# Patient Record
Sex: Female | Born: 1960 | Hispanic: Refuse to answer | Marital: Married | Smoking: Never smoker
Health system: Southern US, Community
[De-identification: ages and names within clinical notes are randomized; demographics above are authoritative.]

## PROBLEM LIST (undated history)

## (undated) DIAGNOSIS — Z5189 Encounter for other specified aftercare: Secondary | ICD-10-CM

## (undated) DIAGNOSIS — R8271 Bacteriuria: Secondary | ICD-10-CM

## (undated) DIAGNOSIS — IMO0001 Reserved for inherently not codable concepts without codable children: Secondary | ICD-10-CM

## (undated) DIAGNOSIS — I10 Essential (primary) hypertension: Secondary | ICD-10-CM

## (undated) DIAGNOSIS — F419 Anxiety disorder, unspecified: Secondary | ICD-10-CM

## (undated) DIAGNOSIS — D649 Anemia, unspecified: Secondary | ICD-10-CM

## (undated) DIAGNOSIS — E7800 Pure hypercholesterolemia, unspecified: Secondary | ICD-10-CM

## (undated) DIAGNOSIS — Z87442 Personal history of urinary calculi: Secondary | ICD-10-CM

## (undated) DIAGNOSIS — N390 Urinary tract infection, site not specified: Secondary | ICD-10-CM

## (undated) DIAGNOSIS — N289 Disorder of kidney and ureter, unspecified: Secondary | ICD-10-CM

## (undated) HISTORY — DX: Personal history of urinary calculi: Z87.442

## (undated) HISTORY — DX: Bacteriuria: R82.71

## (undated) HISTORY — DX: Disorder of kidney and ureter, unspecified: N28.9

## (undated) HISTORY — DX: Pure hypercholesterolemia, unspecified: E78.00

---
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
## 2001-08-22 HISTORY — PX: ENDOMETRIAL BIOPSY: SHX622

## 2002-05-16 ENCOUNTER — Other Ambulatory Visit: Admission: RE | Admit: 2002-05-16 | Discharge: 2002-05-16 | Payer: Self-pay | Admitting: Obstetrics and Gynecology

## 2003-05-26 ENCOUNTER — Other Ambulatory Visit: Admission: RE | Admit: 2003-05-26 | Discharge: 2003-05-26 | Payer: Self-pay | Admitting: Obstetrics and Gynecology

## 2004-05-26 ENCOUNTER — Other Ambulatory Visit: Admission: RE | Admit: 2004-05-26 | Discharge: 2004-05-26 | Payer: Self-pay | Admitting: Obstetrics and Gynecology

## 2005-05-30 ENCOUNTER — Other Ambulatory Visit: Admission: RE | Admit: 2005-05-30 | Discharge: 2005-05-30 | Payer: Self-pay | Admitting: Obstetrics and Gynecology

## 2006-06-01 ENCOUNTER — Other Ambulatory Visit: Admission: RE | Admit: 2006-06-01 | Discharge: 2006-06-01 | Payer: Self-pay | Admitting: Obstetrics and Gynecology

## 2007-07-11 ENCOUNTER — Other Ambulatory Visit: Admission: RE | Admit: 2007-07-11 | Discharge: 2007-07-11 | Payer: Self-pay | Admitting: Obstetrics & Gynecology

## 2008-07-13 ENCOUNTER — Other Ambulatory Visit: Admission: RE | Admit: 2008-07-13 | Discharge: 2008-07-13 | Payer: Self-pay | Admitting: Obstetrics and Gynecology

## 2010-12-09 ENCOUNTER — Encounter (HOSPITAL_COMMUNITY)
Admission: RE | Admit: 2010-12-09 | Discharge: 2010-12-09 | Disposition: A | Discharge status: Home / Self Care | Payer: BC Managed Care – PPO | Source: Ambulatory Visit | Attending: Obstetrics and Gynecology | Admitting: Obstetrics and Gynecology

## 2010-12-09 ENCOUNTER — Other Ambulatory Visit: Payer: Self-pay

## 2010-12-09 ENCOUNTER — Encounter (HOSPITAL_COMMUNITY): Payer: Self-pay

## 2010-12-09 HISTORY — DX: Anemia, unspecified: D64.9

## 2010-12-09 HISTORY — DX: Reserved for inherently not codable concepts without codable children: IMO0001

## 2010-12-09 HISTORY — DX: Urinary tract infection, site not specified: N39.0

## 2010-12-09 HISTORY — DX: Essential (primary) hypertension: I10

## 2010-12-09 HISTORY — DX: Encounter for other specified aftercare: Z51.89

## 2010-12-09 HISTORY — DX: Anxiety disorder, unspecified: F41.9

## 2010-12-09 LAB — COMPREHENSIVE METABOLIC PANEL
AST: 14 U/L (ref 0–37)
Albumin: 3.8 g/dL (ref 3.5–5.2)
Alkaline Phosphatase: 78 U/L (ref 39–117)
BUN: 15 mg/dL (ref 6–23)
Creatinine, Ser: 0.87 mg/dL (ref 0.50–1.10)
Potassium: 3.7 mEq/L (ref 3.5–5.1)
Total Protein: 7.8 g/dL (ref 6.0–8.3)

## 2010-12-09 LAB — SURGICAL PCR SCREEN
MRSA, PCR: NEGATIVE
Staphylococcus aureus: NEGATIVE

## 2010-12-09 LAB — CBC
HCT: 39.7 % (ref 36.0–46.0)
MCHC: 30 g/dL (ref 30.0–36.0)
Platelets: 294 10*3/uL (ref 150–400)
RDW: 17.9 % — ABNORMAL HIGH (ref 11.5–15.5)

## 2010-12-09 NOTE — Anesthesia Preprocedure Evaluation (Addendum)
Anesthesia Evaluation  Name, MR# and DOB Patient awake  General Assessment Comment  Reviewed: Allergy & Precautions, H&P , Patient's Chart, lab work & pertinent test results and reviewed documented beta blocker date and time   History of Anesthesia Complications Negative for: history of anesthetic complications  Airway Mallampati: III TM Distance: <3 FB Neck ROM: full    Dental No notable dental hx    Pulmonaryneg pulmonary ROS    clear to auscultation  pulmonary exam normal   Cardiovascular Exercise Tolerance: Good hypertension, regular Normal   Neuro/Psych (+) {AN ROS/MED HX NEURO HEADACHES (+) Anxiety, Negative Neurological ROS Negative Psych ROS  GI/Hepatic/Renal negative GI ROS, negative Liver ROS, and negative Renal ROS (+)       Endo/Other  Negative Endocrine ROS (+)   Abdominal   Musculoskeletal  Hematology negative hematology ROS (+)   Peds  Reproductive/Obstetrics negative OB ROS   Anesthesia Other Findings EKG- cannot rule out ant infarct.  Asymptomatic with Good exc tolerance.  No need for further work up            Anesthesia Physical Anesthesia Plan  ASA: II  Anesthesia Plan: General   Post-op Pain Management:    Induction:   Airway Management Planned:   Additional Equipment:   Intra-op Plan:   Post-operative Plan:   Informed Consent: I have reviewed the patients History and Physical, chart, labs and discussed the procedure including the risks, benefits and alternatives for the proposed anesthesia with the patient or authorized representative who has indicated his/her understanding and acceptance.   Dental Advisory Given  Plan Discussed with: CRNA and Surgeon  Anesthesia Plan Comments:        Anesthesia Quick Evaluation

## 2010-12-09 NOTE — Patient Instructions (Signed)
20 Jaime Ingram  12/09/2010   Your procedure is scheduled on:  12/19/10  Report to [facility] at 0600 AM.  Call this number if you have problems the morning of surgery: [phone]   Remember:   Do not eat food:After Midnight.  Do not drink clear liquids: After Midnight.  Take these medicines the morning of surgery with A SIP OF WATER:per anesthesia   Do not wear jewelry, make-up or nail polish.  Do not wear lotions, powders, or perfumes. You may wear deodorant.  Do not shave 48 hours prior to surgery.  Do not bring valuables to the hospital.  Contacts, dentures or bridgework may not be worn into surgery.  Leave suitcase in the car. After surgery it may be brought to your room.  For patients admitted to the hospital, checkout time is 11:00 AM the day of discharge.   Patients discharged the day of surgery will not be allowed to drive home.  Name and phone number of your driver: Jonny Ruiz- [phone]  Special Instructions: CHG Shower Use Special Wash: 1/2 bottle night before surgery and 1/2 bottle morning of surgery.   Please read over the following fact sheets that you were given: MRSA Information

## 2010-12-18 MED ORDER — DEXTROSE 5 % IV SOLN
2.0000 g | INTRAVENOUS | Status: AC
  Administered 2010-12-19: 2 g via INTRAVENOUS
  Filled 2010-12-18: qty 2

## 2010-12-18 NOTE — H&P (Signed)
Jaime Ingram is an 50 y.o. female with heavy menstrual bleeding and anemia down to 7.7g with back pain and dysmenorrhea, for R-TLH.  Pus  10/19/2010 showed the uterus was 12 x 9 x 9 cm with about 16 fibroids, the largest of which was about 3 cm.  The adnexa showed bilateral 3 cm clear cysts.  The SHSG showed mutliple submucous fibroids, each about 2 cm.    Pertinent Gynecological History: Menses: flow is excessive with use of 2 pads or tampons on heaviest daysheavy with clots Bleeding: dysfunctional uterine bleeding Contraception: oral progesterone-only contraceptivemicronor, vasectomy  Last pap: normal Date: 09/08/10 normal but pap 07/15/2009 Agus with neg colpo and nl endo bx OB History: G2, P2   Menstrual History:  No LMP recorded.LMP 11/24/2010    Past Medical History  Diagnosis Date  . Hypertension   . UTI (lower urinary tract infection)     recurrent  . Anemia   . Blood transfusion     32 yrs ago  . Anxiety     Past Surgical History  Procedure Date  . Ureter surgery 32 yrs ago  . Appendectomy     No family history on file.  Social History:  reports that she has never smoked. She does not have any smokeless tobacco history on file. She reports that she drinks alcohol. She reports that she does not use illicit drugs.Married, husband John.  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    Allergies: No Known Allergies  Pt. Started on HCTZ 12.5 mg on 12/05/10 pending appt with her PCP. Review of Systems  All other systems reviewed and are negative.    Physical Exam  Constitutional: She is oriented to person, place, and time. She appears well-developed and well-nourished.  HENT:  Head: Normocephalic and atraumatic.  Eyes: Pupils are equal, round, and reactive to light.  Neck: Normal range of motion.  Cardiovascular: Normal rate, regular rhythm and normal heart sounds.   Respiratory: Effort normal and breath sounds normal.  GI: Bowel sounds are normal.    Genitourinary: Vagina normal.  Musculoskeletal: Normal range of motion.  Neurological: She is alert and oriented to person, place, and time.  Skin: Skin is warm and dry.  Psychiatric: She has a normal mood and affect.      Assessment/Plan:  50 yo MWF with symptomatic uterine fibroids, for R-TLH.     Anaston Koehn P 12/18/2010, 8:41 PM

## 2010-12-19 ENCOUNTER — Encounter (HOSPITAL_COMMUNITY)
Admission: RE | Disposition: A | Discharge status: Home / Self Care | Payer: Self-pay | Source: Ambulatory Visit | Attending: Obstetrics and Gynecology

## 2010-12-19 ENCOUNTER — Ambulatory Visit (HOSPITAL_COMMUNITY)
Admission: RE | Admit: 2010-12-19 | Discharge: 2010-12-20 | Disposition: A | Discharge status: Home / Self Care | Payer: BC Managed Care – PPO | Source: Ambulatory Visit | Attending: Obstetrics and Gynecology | Admitting: Obstetrics and Gynecology

## 2010-12-19 ENCOUNTER — Encounter (HOSPITAL_COMMUNITY): Payer: Self-pay | Admitting: Anesthesiology

## 2010-12-19 ENCOUNTER — Other Ambulatory Visit: Payer: Self-pay | Admitting: Obstetrics and Gynecology

## 2010-12-19 ENCOUNTER — Ambulatory Visit (HOSPITAL_COMMUNITY): Payer: BC Managed Care – PPO | Admitting: Certified Registered Nurse Anesthetist

## 2010-12-19 ENCOUNTER — Encounter (HOSPITAL_COMMUNITY): Payer: Self-pay | Admitting: *Deleted

## 2010-12-19 DIAGNOSIS — Z01818 Encounter for other preprocedural examination: Secondary | ICD-10-CM | POA: Insufficient documentation

## 2010-12-19 DIAGNOSIS — N8000 Endometriosis of the uterus, unspecified: Secondary | ICD-10-CM | POA: Insufficient documentation

## 2010-12-19 DIAGNOSIS — D250 Submucous leiomyoma of uterus: Secondary | ICD-10-CM | POA: Insufficient documentation

## 2010-12-19 DIAGNOSIS — D252 Subserosal leiomyoma of uterus: Secondary | ICD-10-CM | POA: Insufficient documentation

## 2010-12-19 DIAGNOSIS — Z01812 Encounter for preprocedural laboratory examination: Secondary | ICD-10-CM | POA: Insufficient documentation

## 2010-12-19 DIAGNOSIS — D251 Intramural leiomyoma of uterus: Secondary | ICD-10-CM | POA: Insufficient documentation

## 2010-12-19 HISTORY — PX: ROBOTIC ASSISTED LAPAROSCOPIC VAGINAL HYSTERECTOMY WITH FIBROID REMOVAL: SHX5387

## 2010-12-19 SURGERY — ROBOTIC ASSISTED TOTAL HYSTERECTOMY
Anesthesia: General | Site: Abdomen | Wound class: Clean Contaminated

## 2010-12-19 MED ORDER — ROCURONIUM BROMIDE 100 MG/10ML IV SOLN
INTRAVENOUS | Status: DC | PRN
  Administered 2010-12-19: 20 mg via INTRAVENOUS
  Administered 2010-12-19: 40 mg via INTRAVENOUS

## 2010-12-19 MED ORDER — NEOSTIGMINE METHYLSULFATE 1 MG/ML IJ SOLN
INTRAMUSCULAR | Status: AC
  Filled 2010-12-19: qty 10

## 2010-12-19 MED ORDER — KETOROLAC TROMETHAMINE 30 MG/ML IJ SOLN: 15.0000 mg | Freq: Once | INTRAMUSCULAR | Status: DC | PRN

## 2010-12-19 MED ORDER — PROPOFOL 10 MG/ML IV EMUL
INTRAVENOUS | Status: AC
  Filled 2010-12-19: qty 20

## 2010-12-19 MED ORDER — FENTANYL CITRATE 0.05 MG/ML IJ SOLN
INTRAMUSCULAR | Status: AC
  Filled 2010-12-19: qty 5

## 2010-12-19 MED ORDER — LACTATED RINGERS IR SOLN
Status: DC | PRN
  Administered 2010-12-19: 3000 mL

## 2010-12-19 MED ORDER — FAMOTIDINE 20 MG PO TABS: 20.0000 mg | ORAL_TABLET | Freq: Once | ORAL | Status: DC | PRN

## 2010-12-19 MED ORDER — NEOSTIGMINE METHYLSULFATE 1 MG/ML IJ SOLN
INTRAMUSCULAR | Status: DC | PRN
  Administered 2010-12-19: 5 mg via INTRAMUSCULAR

## 2010-12-19 MED ORDER — GLYCOPYRROLATE 0.2 MG/ML IJ SOLN
INTRAMUSCULAR | Status: AC
  Filled 2010-12-19: qty 2

## 2010-12-19 MED ORDER — CITRIC ACID-SODIUM CITRATE 334-500 MG/5ML PO SOLN: 30.0000 mL | Freq: Once | ORAL | Status: DC | PRN

## 2010-12-19 MED ORDER — METOCLOPRAMIDE HCL 10 MG PO TABS: 10.0000 mg | ORAL_TABLET | Freq: Once | ORAL | Status: DC | PRN

## 2010-12-19 MED ORDER — LIDOCAINE HCL (CARDIAC) 20 MG/ML IV SOLN
INTRAVENOUS | Status: AC
  Filled 2010-12-19: qty 5

## 2010-12-19 MED ORDER — MIDAZOLAM HCL 5 MG/5ML IJ SOLN
INTRAMUSCULAR | Status: DC | PRN
  Administered 2010-12-19: .5 mg via INTRAVENOUS
  Administered 2010-12-19: 1.5 mg via INTRAVENOUS

## 2010-12-19 MED ORDER — ROPIVACAINE HCL 5 MG/ML IJ SOLN
INTRAMUSCULAR | Status: DC | PRN
  Administered 2010-12-19: 85 mL via EPIDURAL

## 2010-12-19 MED ORDER — CELECOXIB 200 MG PO CAPS: 400.0000 mg | ORAL_CAPSULE | Freq: Two times a day (BID) | ORAL | Status: DC

## 2010-12-19 MED ORDER — PANTOPRAZOLE SODIUM 40 MG PO TBEC: 40.0000 mg | DELAYED_RELEASE_TABLET | Freq: Once | ORAL | Status: DC | PRN

## 2010-12-19 MED ORDER — KETOROLAC TROMETHAMINE 30 MG/ML IJ SOLN
INTRAMUSCULAR | Status: AC
  Filled 2010-12-19: qty 1

## 2010-12-19 MED ORDER — ACETAMINOPHEN 325 MG PO TABS: 325.0000 mg | ORAL_TABLET | ORAL | Status: DC | PRN

## 2010-12-19 MED ORDER — SODIUM CHLORIDE 0.9 % IJ SOLN: 3.0000 mL | Freq: Two times a day (BID) | INTRAMUSCULAR | Status: DC

## 2010-12-19 MED ORDER — ACETAMINOPHEN 10 MG/ML IV SOLN
1000.0000 mg | Freq: Four times a day (QID) | INTRAVENOUS | Status: DC
  Administered 2010-12-19: 1000 mg via INTRAVENOUS
  Filled 2010-12-19 (×4): qty 100

## 2010-12-19 MED ORDER — ONDANSETRON HCL 4 MG/2ML IJ SOLN
INTRAMUSCULAR | Status: DC | PRN
  Administered 2010-12-19 (×2): 2 mg via INTRAVENOUS

## 2010-12-19 MED ORDER — HYDROMORPHONE HCL 1 MG/ML IJ SOLN
INTRAMUSCULAR | Status: AC
  Filled 2010-12-19: qty 1

## 2010-12-19 MED ORDER — GLYCOPYRROLATE 0.2 MG/ML IJ SOLN
INTRAMUSCULAR | Status: DC | PRN
  Administered 2010-12-19: .8 mg via INTRAVENOUS

## 2010-12-19 MED ORDER — DEXTROSE IN LACTATED RINGERS 5 % IV SOLN: INTRAVENOUS | Status: DC

## 2010-12-19 MED ORDER — HYDROMORPHONE HCL 1 MG/ML IJ SOLN
INTRAMUSCULAR | Status: DC | PRN
  Administered 2010-12-19 (×2): 1 mg via INTRAVENOUS

## 2010-12-19 MED ORDER — ROCURONIUM BROMIDE 50 MG/5ML IV SOLN
INTRAVENOUS | Status: AC
  Filled 2010-12-19: qty 1

## 2010-12-19 MED ORDER — SCOPOLAMINE 1 MG/3DAYS TD PT72: 1.0000 | MEDICATED_PATCH | Freq: Once | TRANSDERMAL | Status: DC | PRN

## 2010-12-19 MED ORDER — LIDOCAINE HCL (CARDIAC) 20 MG/ML IV SOLN
INTRAVENOUS | Status: DC | PRN
  Administered 2010-12-19: 60 mg via INTRAVENOUS

## 2010-12-19 MED ORDER — SODIUM CHLORIDE 0.9 % IV SOLN: 250.0000 mL | INTRAVENOUS | Status: DC

## 2010-12-19 MED ORDER — PROPOFOL 10 MG/ML IV EMUL
INTRAVENOUS | Status: DC | PRN
  Administered 2010-12-19: 200 mg via INTRAVENOUS

## 2010-12-19 MED ORDER — FENTANYL CITRATE 0.05 MG/ML IJ SOLN
INTRAMUSCULAR | Status: DC | PRN
  Administered 2010-12-19: 150 ug via INTRAVENOUS
  Administered 2010-12-19: 100 ug via INTRAVENOUS
  Administered 2010-12-19: 50 ug via INTRAVENOUS
  Administered 2010-12-19: 100 ug via INTRAVENOUS

## 2010-12-19 MED ORDER — PROMETHAZINE HCL 25 MG/ML IJ SOLN: 6.2500 mg | INTRAMUSCULAR | Status: DC | PRN

## 2010-12-19 MED ORDER — FENTANYL CITRATE 0.05 MG/ML IJ SOLN: 25.0000 ug | INTRAMUSCULAR | Status: DC | PRN

## 2010-12-19 MED ORDER — DEXAMETHASONE SODIUM PHOSPHATE 10 MG/ML IJ SOLN
INTRAMUSCULAR | Status: DC | PRN
  Administered 2010-12-19 (×2): 5 mg via INTRAVENOUS

## 2010-12-19 MED ORDER — SODIUM CHLORIDE 0.9 % IJ SOLN: 3.0000 mL | INTRAMUSCULAR | Status: DC | PRN

## 2010-12-19 MED ORDER — ONDANSETRON HCL 4 MG/2ML IJ SOLN
INTRAMUSCULAR | Status: AC
  Filled 2010-12-19: qty 2

## 2010-12-19 MED ORDER — MORPHINE SULFATE 4 MG/ML IJ SOLN: 1.0000 mg | INTRAMUSCULAR | Status: DC | PRN

## 2010-12-19 MED ORDER — KETOROLAC TROMETHAMINE 30 MG/ML IJ SOLN
INTRAMUSCULAR | Status: DC | PRN
  Administered 2010-12-19: 30 mg via INTRAVENOUS

## 2010-12-19 MED ORDER — LACTATED RINGERS IV SOLN
INTRAVENOUS | Status: DC
  Administered 2010-12-19: 07:00:00 via INTRAVENOUS

## 2010-12-19 MED ORDER — PROMETHAZINE HCL 25 MG/ML IJ SOLN
12.5000 mg | INTRAMUSCULAR | Status: DC | PRN
  Administered 2010-12-19: 12.5 mg via INTRAVENOUS
  Filled 2010-12-19: qty 1

## 2010-12-19 MED ORDER — MIDAZOLAM HCL 2 MG/2ML IJ SOLN
INTRAMUSCULAR | Status: AC
  Filled 2010-12-19: qty 2

## 2010-12-19 MED ORDER — OXYCODONE-ACETAMINOPHEN 5-325 MG PO TABS
1.0000 | ORAL_TABLET | ORAL | Status: DC | PRN
  Administered 2010-12-19 (×2): 1 via ORAL
  Filled 2010-12-19 (×2): qty 1

## 2010-12-19 MED ORDER — DEXAMETHASONE SODIUM PHOSPHATE 10 MG/ML IJ SOLN
INTRAMUSCULAR | Status: AC
  Filled 2010-12-19: qty 1

## 2010-12-19 SURGICAL SUPPLY — 53 items
BARRIER ADHS 3X4 INTERCEED (GAUZE/BANDAGES/DRESSINGS) ×2 IMPLANT
BENZOIN TINCTURE PRP APPL 2/3 (GAUZE/BANDAGES/DRESSINGS) ×2 IMPLANT
BLADELESS LONG 8MM (BLADE) IMPLANT
CABLE HIGH FREQUENCY MONO STRZ (ELECTRODE) ×2 IMPLANT
CONT PATH 16OZ SNAP LID 3702 (MISCELLANEOUS) ×2 IMPLANT
COVER MAYO STAND STRL (DRAPES) ×2 IMPLANT
COVER TABLE BACK 60X90 (DRAPES) ×4 IMPLANT
COVER TIP SHEARS 8 DVNC (MISCELLANEOUS) ×1 IMPLANT
COVER TIP SHEARS 8MM DA VINCI (MISCELLANEOUS) ×1
DECANTER SPIKE VIAL GLASS SM (MISCELLANEOUS) ×2 IMPLANT
DRAPE HUG U DISPOSABLE (DRAPE) ×2 IMPLANT
DRAPE LG THREE QUARTER DISP (DRAPES) ×4 IMPLANT
DRAPE MONITOR DA VINCI (DRAPE) ×2 IMPLANT
DRAPE UTILITY XL STRL (DRAPES) ×2 IMPLANT
DRAPE WARM FLUID 44X44 (DRAPE) ×2 IMPLANT
ELECT REM PT RETURN 9FT ADLT (ELECTROSURGICAL) ×2
ELECTRODE REM PT RTRN 9FT ADLT (ELECTROSURGICAL) ×1 IMPLANT
EVACUATOR SMOKE 8.L (FILTER) ×2 IMPLANT
GAUZE VASELINE 3X9 (GAUZE/BANDAGES/DRESSINGS) IMPLANT
GLOVE BIO SURGEON STRL SZ 6.5 (GLOVE) ×6 IMPLANT
GLOVE BIOGEL PI IND STRL 7.0 (GLOVE) ×4 IMPLANT
GLOVE BIOGEL PI INDICATOR 7.0 (GLOVE) ×4
GLOVE ECLIPSE 6.5 STRL STRAW (GLOVE) ×8 IMPLANT
GOWN PREVENTION PLUS LG XLONG (DISPOSABLE) ×14 IMPLANT
KIT DISP ACCESSORY 4 ARM (KITS) ×2 IMPLANT
NEEDLE INSUFFLATION 14GA 120MM (NEEDLE) ×2 IMPLANT
NS IRRIG 1000ML POUR BTL (IV SOLUTION) ×6 IMPLANT
OCCLUDER COLPOPNEUMO (BALLOONS) ×2 IMPLANT
PACK LAVH (CUSTOM PROCEDURE TRAY) ×2 IMPLANT
PAD PREP 24X48 CUFFED NSTRL (MISCELLANEOUS) ×4 IMPLANT
SET IRRIG TUBING LAPAROSCOPIC (IRRIGATION / IRRIGATOR) ×2 IMPLANT
SOLUTION ELECTROLUBE (MISCELLANEOUS) ×2 IMPLANT
SPONGE LAP 18X18 X RAY DECT (DISPOSABLE) IMPLANT
STERI STRIP BROWN 1/4X3 R155 (GAUZE/BANDAGES/DRESSINGS) ×2 IMPLANT
STRIP CLOSURE SKIN 1/4X4 (GAUZE/BANDAGES/DRESSINGS) ×2 IMPLANT
SUT VIC AB 0 CT1 27 (SUTURE) ×5
SUT VIC AB 0 CT1 27XBRD ANTBC (SUTURE) ×5 IMPLANT
SUT VICRYL 0 UR6 27IN ABS (SUTURE) ×2 IMPLANT
SUT VICRYL RAPIDE 4/0 PS 2 (SUTURE) ×4 IMPLANT
SYR 50ML LL SCALE MARK (SYRINGE) ×2 IMPLANT
SYSTEM CONVERTIBLE TROCAR (TROCAR) IMPLANT
TIP UTERINE 5.1X6CM LAV DISP (MISCELLANEOUS) IMPLANT
TIP UTERINE 6.7X10CM GRN DISP (MISCELLANEOUS) ×2 IMPLANT
TIP UTERINE 6.7X6CM WHT DISP (MISCELLANEOUS) IMPLANT
TIP UTERINE 6.7X8CM BLUE DISP (MISCELLANEOUS) IMPLANT
TOWEL OR 17X24 6PK STRL BLUE (TOWEL DISPOSABLE) ×6 IMPLANT
TRAY FOLEY BAG SILVER LF 14FR (CATHETERS) ×2 IMPLANT
TROCAR 12M 150ML BLUNT (TROCAR) IMPLANT
TROCAR DISP BLADELESS 8 DVNC (TROCAR) ×1 IMPLANT
TROCAR DISP BLADELESS 8MM (TROCAR) ×1
TROCAR XCEL 12X100 BLDLESS (ENDOMECHANICALS) ×2 IMPLANT
TUBING FILTER THERMOFLATOR (ELECTROSURGICAL) ×2 IMPLANT
WARMER LAPAROSCOPE (MISCELLANEOUS) ×2 IMPLANT

## 2010-12-19 NOTE — Transfer of Care (Signed)
Immediate Anesthesia Transfer of Care Note  Patient: Jaime Ingram  Procedure(s) Performed:  ROBOTIC ASSISTED TOTAL HYSTERECTOMY  Patient Location: PACU  Anesthesia Type: General  Level of Consciousness: awake, alert  and oriented  Airway & Oxygen Therapy: Patient Spontanous Breathing and Patient connected to nasal cannula oxygen  Post-op Assessment: Report given to PACU RN, Post -op Vital signs reviewed and stable and Patient moving all extremities X 4  Post vital signs: Reviewed and stable  Complications: No apparent anesthesia complications

## 2010-12-19 NOTE — Preoperative (Signed)
Beta Blockers   Reason not to administer Beta Blockers:Not prescribed

## 2010-12-19 NOTE — Anesthesia Postprocedure Evaluation (Signed)
  Anesthesia Post-op Note  Patient: Jaime Ingram  Procedure(s) Performed:  ROBOTIC ASSISTED TOTAL HYSTERECTOMY  Patient Location: PACU and Women's Unit  Anesthesia Type: General  Level of Consciousness: awake, alert  and oriented  Airway and Oxygen Therapy: Patient Spontanous Breathing   Post-op Assessment: Post-op Vital signs reviewed and Patient's Cardiovascular Status Stable  Post-op Vital Signs: Reviewed and stable  Complications: No apparent anesthesia complications

## 2010-12-19 NOTE — Anesthesia Postprocedure Evaluation (Signed)
  Anesthesia Post Note  Patient: Jaime Ingram  Procedure(s) Performed:  ROBOTIC ASSISTED TOTAL HYSTERECTOMY  Anesthesia type: GA  Patient location: PACU  Post pain: Pain level controlled  Post assessment: Post-op Vital signs reviewed  Last Vitals:  Filed Vitals:   12/19/10 1200  BP: 145/68  Pulse: 60  Temp: 98.7 F (37.1 C)  Resp: 12    Post vital signs: Reviewed  Level of consciousness: sedated  Complications: No apparent anesthesia complications

## 2010-12-19 NOTE — Op Note (Signed)
Preoperative diagnosis multiple uterine fibroids symptomatic Postoperative diagnosis same Path pending Procedure robotic assisted total laparoscopic hysterectomy Surgeon Dr. Aram Beecham Keghan Mcfarren Asst. Dr. Leda Quail Anesthesia Gen. endotracheal Estimated blood loss 75 cc Complications none Procedure The patient was taken to the operating room and after induction of adequate general endotracheal anesthesia was placed in the low dorsolithotomy position and prepped and draped in usual fashion.  A posterior weighted and anterior Deaver were placed the cervix was grasped on its anterior lip with a single-tooth tenaculum.  The uterus sounded to 11 cm.  The cervix was dilated to #23 Shawnie Pons.  A 10 cm Rumi manipulator with a medium Koh ring was placed on the cervix without difficulty.  Prior to placement of the Koh ring the vagina had been infiltrated with a total of 10 cc of ropivacaine 0.5% that had been diluted one-to-one with sterile saline.  A Foley catheter was inserted.  Attention was next turned to the abdomen.  A small incision was made just above the umbilicus after infiltrating the skin with the diluted half percent ropivacaine.  The skin was incised with a knife.  A Veress needle was inserted into the peritoneal space.  Proper placement of the Veress needle was tested by noting a negative aspirate then free flow of saline through the varies needle again with a negative aspirate and then by noting the response of a drop of saline  placed at the hub of the Verres needle to negative pressure as the abdominal wall was elevated.  A 12 mm disposable trocar was then placed into the peritoneal space and proper placement noted with the laparoscope 60 cc of the ropivacaine solution was inserted through the trocar to date the pelvis sites for the assistant's port and the robotic ports were marked with a marking pen and transilluminated the sites were infiltrated with a dilute ropivacaine solution incised with a knife  and the robotic ports were placed approximately 10 cm lateral to the umbilicus incision on each side and these were placed under direct visualization.  The 12 mm assistant's port the right lower quadrant was also placed and draped visualization.  The robot was then brought in and out from the side.  Monopolar scissors were inserted into port 1 and direct visualization and the PK gyrus was inserted into port 2 again under direct visualization.  The surgeon then proceeded to the consult.  The anatomy was inspected the uterus was quite enlarged consistent with 14 weeks size and multinodular.  The ovaries were normal.  The tubes were also normal.  The anterior posterior cul-de-sacs were free.  The ureters were identified peristalsing on both sides.  The procedure began on the patient's right.  The utero-ovarian ligament tube and mesosalpinx were coagulated and then cut with the monopolar scissors the round ligament was coagulated with the PK gyrus and cut the monopolar scissors anterior and posterior leaves of the broad ligament were opened sharply.  The bladder was sharply dissected dissected off the cervix.  Uterine artery on the patient's right was skeletonized multiply coagulated and cut.  Attention then turned to the patient's left.  The utero-ovarian ligament tube and mesosalpinx were coagulated and then cut. the round ligament was also coagulated and cut. the anterior and posterior leaves of the broad ligament were taken down sharply.  The bladder was further dissected off the cervix.  The uterine artery on the patient's left was skeletonized, coagulated, and cut.  Because the uterus was so large it was felt necessary to  remove some of the fibroids in order to allow the uterus to be delivered through the vagina area at to fibroids at the fundus were incised the monopolar scissors and then dissected so that they were dangling off the top of the fundus and one fibroid was completely dissected out of the uterus.  A  colpotomy incision was made with the monopolar scissors circumferentially.  An attempt was made to deliver the uterus vaginally however it was still too large to come through the vagina.  The assistant then went vaginally and did a morcellation until the uterus could be delivered.  The total uterine weight was 320 g.  The instruments were then changed out for a negative suture cut and port one and a Prograf and port 2.  5 sutures of 0 Vicryl were used in figure-of-eight fashion to close the vaginal cuff.  Hemostasis was noted.  The pelvis was urinated and again good hemostasis was noted.  The ureters were seen peristalsing bilaterally.  A sheet of Interceed was then introduced and the pelvis then placed over the vaginal cuff.  The robotic instruments were then removed. Visualization of the upper abdomen revealed the liver to be smooth.  The stomach was normal.  No upper abdominal abnormalities were noted.  The trocar sleeves were then removed under direct visualization.  Pneumoperitoneum was allowed to escape through the trocar sleeve at the umbilicus prior to its removal.  The fascia at the umbilicus port and the assistant's port was closed with a single suture of 0 Vicryl.  The skin was closed subcuticularly with 4-0 Vicryl Rapide, benzoin, and Steri-Strips.  Instruments removed from the vagina and the procedure was terminated.  Sponge, needle, and instrument counts were correct x3.

## 2010-12-19 NOTE — Progress Notes (Signed)
Encounter addended by: Cephus Shelling on: 12/19/2010  5:56 PM<BR>     Documentation filed: Notes Section

## 2010-12-20 NOTE — Progress Notes (Signed)
1 Day Post-Op Procedure(s): ROBOTIC ASSISTED TOTAL HYSTERECTOMY  Subjective: Patient reports no problems voiding.feeling good.  Adequate pain control.  No bleeding.  Tolerating reg diet.     Objective: I have reviewed patient's vital signs.VSS afebrile.  Abd soft, NT.  Good UOP.  General: alert  Assessment: s/p Procedure(s): ROBOTIC ASSISTED TOTAL HYSTERECTOMY: stable  Plan: D/C/ home. Instructed.  LOS: 1 day    Majesty Stehlin P 12/20/2010, 10:56 AM

## 2010-12-23 NOTE — Discharge Summary (Signed)
Physician Discharge Summary  Patient ID: Jaime Ingram MRN: [id number] DOB/AGE: 12-Nov-1960 50 y.o.  Admit date: 12/19/2010 Discharge date: 12/23/2010  Admission Diagnoses: menorrhagia, fibroids Discharge Diagnoses: same, plus adenomysisi Active Problems:  * No active hospital problems. *    Discharged Condition: good  Hospital Course: Pt admitted and underwent Robotic TLH for a 321g fibroid uterus.  Surgery was uncomplicated.  Post op course uneventful.  D/C'd am of POD 1.  Consults: none  Significant Diagnostic Studies: labs: none  Treatments: surgery: as above  Discharge Exam: Blood pressure 167/90, pulse 76, temperature 99.5 F (37.5 C), temperature source Oral, resp. rate 18, height 5\' 4"  (1.626 m), weight 80.287 kg (177 lb), SpO2 98.00%. nl post op exam.    Disposition: Home or Self Care   Discharge Medication List as of 12/20/2010 12:28 PM    CONTINUE these medications which have NOT CHANGED   Details  calcium-vitamin D (OSCAL WITH D) 500-200 MG-UNIT per tablet Take 1 tablet by mouth daily.  , Until Discontinued, Historical Med    CRANBERRY PO Take 1 tablet by mouth.  , Until Discontinued, Historical Med    fish oil-omega-3 fatty acids 1000 MG capsule Take 2 g by mouth daily.  , Until Discontinued, Historical Med    GARLIC PO Take 1 tablet by mouth.  , Until Discontinued, Historical Med    hydrochlorothiazide (HYDRODIURIL) 12.5 MG tablet Take 12.5 mg by mouth daily.  , Until Discontinued, Historical Med    IRON PO Take 1 tablet by mouth daily.  , Until Discontinued, Historical Med    Multiple Vitamins-Minerals (MULTIVITAMIN WITH MINERALS) tablet Take 1 tablet by mouth daily.  , Until Discontinued, Historical Med      STOP taking these medications     norethindrone (MICRONOR,CAMILA,ERRIN) 0.35 MG tablet        Follow-up Information    Follow up with Tracie Lindbloom P on 01/20/2011. (4:30 pm)    Contact information:   [address] [place] [address] [phone]          Signed: Alison Murray 12/23/2010, 1:58 PM

## 2011-10-26 LAB — HM MAMMOGRAPHY: HM Mammogram: NEGATIVE

## 2012-10-11 ENCOUNTER — Encounter: Payer: Self-pay | Admitting: *Deleted

## 2012-10-16 ENCOUNTER — Encounter: Payer: Self-pay | Admitting: Nurse Practitioner

## 2012-10-16 ENCOUNTER — Ambulatory Visit (INDEPENDENT_AMBULATORY_CARE_PROVIDER_SITE_OTHER): Payer: BC Managed Care – PPO | Admitting: Nurse Practitioner

## 2012-10-16 VITALS — BP 108/70 | HR 72 | Resp 14 | Ht 64.0 in | Wt 188.4 lb

## 2012-10-16 DIAGNOSIS — Z01419 Encounter for gynecological examination (general) (routine) without abnormal findings: Secondary | ICD-10-CM

## 2012-10-16 DIAGNOSIS — Z0000 Encounter for general adult medical examination without abnormal findings: Secondary | ICD-10-CM

## 2012-10-16 LAB — POCT URINALYSIS DIPSTICK: Spec Grav, UA: 1.015

## 2012-10-16 NOTE — Progress Notes (Signed)
52 y.o. G4P2 Married Caucasian Fe here for annual exam.  Since last year with hysterectomy she has done so much better.  Feels well, and seems motivated to be healthy and changing habits. No other new health diagnosis except recent change in BP med's. Occasional vaso symptoms. No vaginal dryness.  Husband who had prostate cancer is doing well.  Patient's last menstrual period was 11/28/2010.          Sexually active: yes  The current method of family planning is status post hysterectomy.    Exercising: yes  swimming, walking and some weights Smoker:  no  Health Maintenance: Pap:  09/06/2010  Negative (history of AGUS 07/2009 MMG:  10/2011  scheduled for next week Colonoscopy:  05/2011 normal BMD:   never TDaP:  10/16/2011 Labs: PCP does blood (labs) work.   reports that she has never smoked. She has never used smokeless tobacco. She reports that she drinks about 0.5 ounces of alcohol per week. She reports that she does not use illicit drugs.  Past Medical History  Diagnosis Date  . Hypertension   . UTI (lower urinary tract infection)     recurrent  . Anemia   . Blood transfusion     32 yrs ago  . Anxiety   . Iron deficiency   . Asymptomatic bacteriuria     Past Surgical History  Procedure Laterality Date  . Ureter surgery  32 yrs ago  . Appendectomy    . Ureter repair      to right kidney  . Endometrial biopsy  08/22/2001  . Abdominal hysterectomy      TLH    Current Outpatient Prescriptions  Medication Sig Dispense Refill  . acetaminophen (TYLENOL) 325 MG tablet Take 650 mg by mouth every 6 (six) hours as needed for pain.      Marland Kitchen amLODipine-valsartan (EXFORGE) 10-320 MG per tablet Take 1 tablet by mouth daily.      . fish oil-omega-3 fatty acids 1000 MG capsule Take 2 g by mouth daily.      . hydrochlorothiazide (HYDRODIURIL) 12.5 MG tablet Take 12.5 mg by mouth daily.        . Multiple Vitamins-Minerals (MULTIVITAMIN WITH MINERALS) tablet Take 1 tablet by mouth daily.         . pravastatin (PRAVACHOL) 40 MG tablet Take 40 mg by mouth daily.       No current facility-administered medications for this visit.    Family History  Problem Relation Age of Onset  . Diabetes Father   . Hypertension Father     ROS:  Pertinent items are noted in HPI.  Otherwise, a comprehensive ROS was negative.  Exam:   BP 108/70  Pulse 72  Resp 14  Ht 5\' 4"  (1.626 m)  Wt 188 lb 6.4 oz (85.458 kg)  BMI 32.32 kg/m2  LMP 11/28/2010 Height: 5\' 4"  (162.6 cm)  Ht Readings from Last 3 Encounters:  10/16/12 5\' 4"  (1.626 m)  12/19/10 5\' 4"  (1.626 m)  12/19/10 5\' 4"  (1.626 m)    General appearance: alert, cooperative and appears stated age Head: Normocephalic, without obvious abnormality, atraumatic Neck: no adenopathy, supple, symmetrical, trachea midline and thyroid normal to inspection and palpation Lungs: clear to auscultation bilaterally Breasts: normal appearance, no masses or tenderness Heart: regular rate and rhythm Abdomen: soft, non-tender; no masses,  no organomegaly Extremities: extremities normal, atraumatic, no cyanosis or edema Skin: Skin color, texture, turgor normal. No rashes or lesions Lymph nodes: Cervical, supraclavicular, and axillary nodes  normal. No abnormal inguinal nodes palpated Neurologic: Grossly normal   Pelvic: External genitalia:  no lesions              Urethra:  normal appearing urethra with no masses, tenderness or lesions              Bartholin's and Skene's: normal                 Vagina: normal appearing vagina with normal color and discharge, no lesions              Cervix: absent              Pap taken: no Bimanual Exam:  Uterus:  uterus absent              Adnexa: no mass, fullness, tenderness               Rectovaginal: Confirms               Anus:  normal sphincter tone, no lesions  A:  Well Woman with normal exam  S/P R-TLH secondary to fibroids and adenomyosis 12/19/10   HTN  P:   Pap smear as per guidelines Not  indicated  Mammogram as scheduled  counseled on adequate intake of calcium and vitamin D, diet and exercise,   Kegel's exercises return annually or prn  An After Visit Summary was printed and given to the patient.

## 2012-10-16 NOTE — Progress Notes (Signed)
Reviewed personally.  M. Suzanne Julissa Browning, MD.  

## 2012-10-16 NOTE — Patient Instructions (Signed)

## 2013-09-15 ENCOUNTER — Encounter: Payer: Self-pay | Admitting: Nurse Practitioner

## 2013-09-16 ENCOUNTER — Encounter: Payer: Self-pay | Admitting: Nurse Practitioner

## 2013-10-20 ENCOUNTER — Ambulatory Visit: Payer: BC Managed Care – PPO | Admitting: Nurse Practitioner

## 2013-11-14 ENCOUNTER — Ambulatory Visit (INDEPENDENT_AMBULATORY_CARE_PROVIDER_SITE_OTHER): Payer: BC Managed Care – PPO | Admitting: Nurse Practitioner

## 2013-11-14 ENCOUNTER — Encounter: Payer: Self-pay | Admitting: Nurse Practitioner

## 2013-11-14 VITALS — BP 96/64 | HR 76 | Ht 64.0 in | Wt 186.0 lb

## 2013-11-14 DIAGNOSIS — Z0000 Encounter for general adult medical examination without abnormal findings: Secondary | ICD-10-CM

## 2013-11-14 DIAGNOSIS — Z01419 Encounter for gynecological examination (general) (routine) without abnormal findings: Secondary | ICD-10-CM

## 2013-11-14 NOTE — Progress Notes (Signed)
Patient ID: Jaime Ingram, female   DOB: 14-Dec-1960, 53 y.o.   MRN: [id number] 53 y.o. G4P2 Married Caucasian Fe here for annual exam.   Still on med's for cholesterol and HTN. UTI a month ago. and was on Cipro. At the urologist they did [place].  Kidney stone was found in right kidney and 1 on the left.  Urologist is having her on a 6 month watch.  Then will have a repeat CT and labs done in December.  Patient's last menstrual period was 11/28/2010.          Sexually active: Yes.    The current method of family planning is status post hysterectomy.    Exercising: Yes.    Home exercise routine includes swimming and walking. Smoker:  no  Health Maintenance: Pap: 09/06/2010 Negative (history of AGUS 07/2009  MMG: 11/14/13, report not available at time of apt Colonoscopy: 05/2011 normal  TDaP: 10/16/2011  Labs: PCP does blood (labs) work.    Urine:  [facility] within last 30 days    reports that she has never smoked. She has never used smokeless tobacco. She reports that she drinks about .5 ounces of alcohol per week. She reports that she does not use illicit drugs.  Past Medical History  Diagnosis Date  . Hypertension   . UTI (lower urinary tract infection)     recurrent  . Anemia   . Blood transfusion     32 yrs ago  . Anxiety   . Asymptomatic bacteriuria   . History of kidney stones     Past Surgical History  Procedure Laterality Date  . Ureter surgery  1980  . Endometrial biopsy  08/22/2001  . Appendectomy  1972  . Robotic assisted laparoscopic vaginal hysterectomy with fibroid removal  12/19/10    Ovaries remain    Current Outpatient Prescriptions  Medication Sig Dispense Refill  . amLODipine-valsartan (EXFORGE) 10-320 MG per tablet Take 1 tablet by mouth daily.      . Calcium-Magnesium-Vitamin D 300-20-200 MG-MG-UNIT CHEW Chew 1 each by mouth daily.      . Coenzyme Q10 (CO Q 10 PO) Take 300 mg by mouth daily.      . hydrochlorothiazide (HYDRODIURIL) 25 MG tablet Take 25 mg by  mouth daily.      . Multiple Vitamins-Minerals (MULTIVITAMIN WITH MINERALS) tablet Take 1 tablet by mouth daily.        . pravastatin (PRAVACHOL) 40 MG tablet Take 40 mg by mouth daily.       No current facility-administered medications for this visit.    Family History  Problem Relation Age of Onset  . Diabetes Father   . Hypertension Father   . Hyperlipidemia Sister   . Hypertension Sister   . Hyperlipidemia Sister   . Hypertension Sister   . Hyperlipidemia Sister   . Hypertension Sister   . Hyperlipidemia Sister   . Hypertension Sister     ROS:  Pertinent items are noted in HPI.  Otherwise, a comprehensive ROS was negative.  Exam:   BP 96/64  Pulse 76  Ht 5\' 4"  (1.626 m)  Wt 186 lb (84.369 kg)  BMI 31.91 kg/m2  LMP 11/28/2010 Height: 5\' 4"  (440.3 cm)  Ht Readings from Last 3 Encounters:  11/14/13 5\' 4"  (1.626 m)  10/16/12 5\' 4"  (1.626 m)  12/19/10 5\' 4"  (1.626 m)    General appearance: alert, cooperative and appears stated age Head: Normocephalic, without obvious abnormality, atraumatic Neck: no adenopathy, supple, symmetrical, trachea midline  and thyroid normal to inspection and palpation Lungs: clear to auscultation bilaterally Breasts: normal appearance, no masses or tenderness Heart: regular rate and rhythm Abdomen: soft, non-tender; no masses,  no organomegaly Extremities: extremities normal, atraumatic, no cyanosis or edema Skin: Skin color, texture, turgor normal. No rashes or lesions Lymph nodes: Cervical, supraclavicular, and axillary nodes normal. No abnormal inguinal nodes palpated Neurologic: Grossly normal   Pelvic: External genitalia:  no lesions              Urethra:  normal appearing urethra with no masses, tenderness or lesions              Bartholin's and Skene's: normal                 Vagina: normal appearing vagina with normal color and discharge, no lesions              Cervix: absent              Pap taken: Yes.   Bimanual Exam:   Uterus:  uterus absent              Adnexa: no mass, fullness, tenderness               Rectovaginal: Confirms               Anus:  normal sphincter tone, no lesions  A:  Well Woman with normal exam  S/P R-TLH secondary to fibroids 12/19/10  History of AGUS pap 2011  History of Renal Calculi  History of Ureteral surgery Right - at age 55  P:   Reviewed health and wellness pertinent to exam  Pap smear taken today  Mammogram is due 11/2014  Counseled on breast self exam, mammography screening, adequate intake of calcium and vitamin D, diet and exercise, Kegel's exercises return annually or prn  An After Visit Summary was printed and given to the patient.

## 2013-11-14 NOTE — Patient Instructions (Signed)

## 2013-11-17 NOTE — Progress Notes (Signed)
Note reviewed, agree with plan.  Clell Trahan, MD  

## 2013-11-20 ENCOUNTER — Ambulatory Visit: Payer: BC Managed Care – PPO | Admitting: Nurse Practitioner

## 2013-11-20 LAB — IPS PAP TEST WITH HPV

## 2013-11-25 ENCOUNTER — Ambulatory Visit: Payer: BC Managed Care – PPO | Admitting: Nurse Practitioner

## 2014-01-08 ENCOUNTER — Telehealth: Payer: Self-pay | Admitting: Nurse Practitioner

## 2014-01-08 NOTE — Telephone Encounter (Signed)
Patient is calling regarding her EOB from her secondary insurance Patient says the EOB is stating the lab did not get her primary insurance. I told patient she needs to call [facility] and give her primary insurance.info. She said this EOB if [place] out of [place]? She has no info to contact them.

## 2014-03-09 ENCOUNTER — Encounter: Payer: Self-pay | Admitting: Nurse Practitioner

## 2014-06-16 ENCOUNTER — Telehealth: Payer: Self-pay | Admitting: Nurse Practitioner

## 2014-06-16 NOTE — Telephone Encounter (Signed)
Left message regarding upcoming appointment has been canceled and needs to be rescheduled. °

## 2014-09-14 ENCOUNTER — Other Ambulatory Visit: Payer: Self-pay | Admitting: Family Medicine

## 2014-09-14 DIAGNOSIS — R911 Solitary pulmonary nodule: Secondary | ICD-10-CM

## 2014-11-27 ENCOUNTER — Ambulatory Visit: Payer: BC Managed Care – PPO | Admitting: Nurse Practitioner

## 2014-11-30 ENCOUNTER — Ambulatory Visit: Payer: BC Managed Care – PPO | Admitting: Nurse Practitioner

## 2014-12-29 ENCOUNTER — Encounter: Payer: Self-pay | Admitting: Nurse Practitioner

## 2014-12-29 ENCOUNTER — Ambulatory Visit (INDEPENDENT_AMBULATORY_CARE_PROVIDER_SITE_OTHER): Payer: BC Managed Care – PPO | Admitting: Nurse Practitioner

## 2014-12-29 VITALS — BP 110/66 | HR 68 | Ht 64.25 in | Wt 185.0 lb

## 2014-12-29 DIAGNOSIS — Z0000 Encounter for general adult medical examination without abnormal findings: Secondary | ICD-10-CM | POA: Diagnosis not present

## 2014-12-29 DIAGNOSIS — Z01419 Encounter for gynecological examination (general) (routine) without abnormal findings: Secondary | ICD-10-CM | POA: Diagnosis not present

## 2014-12-29 NOTE — Progress Notes (Signed)
Patient ID: Jaime Ingram, female   DOB: 02-18-1961, 54 y.o.   MRN: [id number] 54 y.o. G81P2002 Married  Caucasian Fe here for annual exam.  Last fall had another UTI and treated.  She then had CT scan on kidneys with renal stones on the right. There was something ? that showed up on one of the lung bases and she is scheduled by PCP to have a chest CT scan done this December.  The CT scan also showed normal ovaries.  She has some abnormal GFR with renal studies. She was treated with increase of fluids and avoid dehydration. She has also been seeing Uro PT and had good results with pelvic floor prolapse exercise.  She is doing ball exercise. Her husband also has benefited from these exercises as he is recovered from prostate cancer.    Patient's last menstrual period was 11/28/2010 (exact date).          Sexually active: Yes.    The current method of family planning is status post hysterectomy.    Exercising: Yes.    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 She has never used smokeless tobacco. She reports that she drinks about 0.5 oz of alcohol per week. She reports that she does not use illicit drugs.  Past Medical History  Diagnosis Date  . Hypertension   . UTI (lower urinary tract infection)     recurrent  . Anemia   . Blood transfusion     32 yrs ago  . Anxiety   . Asymptomatic bacteriuria   . History of kidney stones     Past Surgical History  Procedure Laterality Date  . Ureter surgery  1980  . Endometrial biopsy  08/22/2001  . Appendectomy  1972  . Robotic assisted laparoscopic vaginal hysterectomy with fibroid removal  12/19/10    Ovaries remain    Current Outpatient Prescriptions   Medication Sig Dispense Refill  . Amlodipine-Valsartan-HCTZ 10-320-25 MG TABS Take 1 tablet by mouth every morning.  5  . Coenzyme Q10 (CO Q 10 PO) Take 300 mg by mouth daily.    . Multiple Vitamins-Minerals (MULTIVITAMIN WITH MINERALS) tablet Take 1 tablet by mouth daily.      . pravastatin (PRAVACHOL) 40 MG tablet Take 40 mg by mouth daily.     No current facility-administered medications for this visit.    Family History  Problem Relation Age of Onset  . Diabetes Father   . Hypertension Father   . Hyperlipidemia Sister   . Hypertension Sister   . Hyperlipidemia Sister   . Hypertension Sister   . Hyperlipidemia Sister   . Hypertension Sister   . Hyperlipidemia Sister   . Hypertension Sister     ROS:  Pertinent items are noted in HPI.  Otherwise, a comprehensive ROS was negative.  Exam:   BP 110/66 mmHg  Pulse 68  Ht 5' 4.25" (1.632 m)  Wt 185 lb (83.915 kg)  BMI 31.51 kg/m2  LMP 11/28/2010 (Exact Date) Height: 5' 4.25" (163.2 cm) Ht Readings from Last 3 Encounters:  12/29/14 5' 4.25" (1.632 m)  11/14/13 5\' 4"  (1.626 m)  10/16/12 5\' 4"  (1.626 m)    General appearance: alert, cooperative and appears stated age  Head: Normocephalic, without obvious abnormality, atraumatic Neck: no adenopathy, supple, symmetrical, trachea midline and thyroid normal to inspection and palpation Lungs: clear to auscultation bilaterally Breasts: normal appearance, no masses or tenderness Heart: regular rate and rhythm Abdomen: soft, non-tender; no masses,  no organomegaly Extremities: extremities normal, atraumatic, no cyanosis or edema Skin: Skin color, texture, turgor normal. No rashes or lesions Lymph nodes: Cervical, supraclavicular, and axillary nodes normal. No abnormal inguinal nodes palpated Neurologic: Grossly normal   Pelvic: External genitalia:  no lesions              Urethra:  normal appearing urethra with no masses, tenderness or lesions              Bartholin's and  Skene's: normal                 Vagina: normal appearing vagina with normal color and discharge, no lesions              Cervix: absent              Pap taken: No. Bimanual Exam:  Uterus:  uterus absent              Adnexa: no mass, fullness, tenderness               Rectovaginal: Confirms               Anus:  normal sphincter tone, no lesions  Chaperone present: no  A:  Well Woman with normal exam  S/P R-TLH secondary to fibroids 12/19/10 History of AGUS pap 2011 with negative endo biopsy History of Renal Calculi - [facility] follows  Abnormal GFR - PCP follows History of Ureteral surgery Right - at age 41   P:   Reviewed health and wellness pertinent to exam  Pap smear as above  Mammogram is due 11/2015  Counseled on breast self exam, mammography screening, adequate intake of calcium and vitamin D, diet and exercise, Kegel's exercises return annually or prn  An After Visit Summary was printed and given to the patient.

## 2014-12-29 NOTE — Patient Instructions (Signed)

## 2014-12-31 NOTE — Progress Notes (Signed)
Encounter reviewed by Dr. Brook Amundson C. Silva.  

## 2015-04-23 ENCOUNTER — Ambulatory Visit
Admission: RE | Admit: 2015-04-23 | Discharge: 2015-04-23 | Disposition: A | Discharge status: Home / Self Care | Payer: BC Managed Care – PPO | Source: Ambulatory Visit | Attending: Family Medicine | Admitting: Family Medicine

## 2015-04-23 DIAGNOSIS — R911 Solitary pulmonary nodule: Secondary | ICD-10-CM

## 2015-12-21 ENCOUNTER — Encounter: Payer: Self-pay | Admitting: Nurse Practitioner

## 2015-12-31 ENCOUNTER — Encounter: Payer: Self-pay | Admitting: Nurse Practitioner

## 2015-12-31 ENCOUNTER — Ambulatory Visit (INDEPENDENT_AMBULATORY_CARE_PROVIDER_SITE_OTHER): Payer: BC Managed Care – PPO | Admitting: Nurse Practitioner

## 2015-12-31 VITALS — BP 120/68 | HR 64 | Ht 64.25 in | Wt 196.0 lb

## 2015-12-31 DIAGNOSIS — Z0000 Encounter for general adult medical examination without abnormal findings: Secondary | ICD-10-CM | POA: Diagnosis not present

## 2015-12-31 DIAGNOSIS — Z01419 Encounter for gynecological examination (general) (routine) without abnormal findings: Secondary | ICD-10-CM | POA: Diagnosis not present

## 2015-12-31 NOTE — Progress Notes (Signed)
Reviewed personally.  M. Suzanne Cranston Koors, MD.  

## 2015-12-31 NOTE — Patient Instructions (Addendum)

## 2015-12-31 NOTE — Progress Notes (Signed)
Patient ID: Jaime Ingram, female   DOB: 1961-02-05, 55 y.o.   MRN: [id number]  55 y.o. G66P2002 Married  Caucasian Fe here for annual exam.  No further kidney stone.  Repeat CT of abdomen 12/05/13/14 with right kidney scarring. Lung nodule that was benign.  She is a;so seeing a Restaurant manager, fast food.  Her back is better and uses TENS prn.  No migraine since then.  Also had PT which has helped her bladder problems. [facility]   Patient's last menstrual period was 11/28/2010 (exact date).          Sexually active: Yes.    The current method of family planning is status post hysterectomy.    Exercising: Yes.    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 She has never used smokeless tobacco. She reports that she drinks about 0.5 oz of alcohol per week . She reports that she does not use drugs.  Past Medical History:  Diagnosis Date  . Anemia   . Anxiety   . Asymptomatic bacteriuria   . Blood transfusion    32 yrs ago  . History of kidney stones   . Hypertension   . UTI (lower urinary tract infection)    recurrent    Past Surgical History:  Procedure Laterality Date  . APPENDECTOMY  1972  . ENDOMETRIAL BIOPSY  08/22/2001  . ROBOTIC ASSISTED LAPAROSCOPIC VAGINAL HYSTERECTOMY WITH FIBROID REMOVAL  12/19/10   Ovaries remain  . URETER SURGERY  1980    Current Outpatient Prescriptions  Medication Sig Dispense Refill  . Amlodipine-Valsartan-HCTZ 10-320-25 MG TABS Take 1 tablet by mouth every morning.  5  . Coenzyme Q10 (CO Q 10 PO) Take 300 mg by mouth daily.    . Multiple Vitamins-Minerals (MULTIVITAMIN WITH MINERALS) tablet Take 1 tablet by  mouth daily.      . pravastatin (PRAVACHOL) 40 MG tablet Take 40 mg by mouth daily.     No current facility-administered medications for this visit.     Family History  Problem Relation Age of Onset  . Diabetes Father   . Hypertension Father   . Hyperlipidemia Sister   . Hypertension Sister   . Hyperlipidemia Sister   . Hypertension Sister   . Hyperlipidemia Sister   . Hypertension Sister   . Hyperlipidemia Sister   . Hypertension Sister     ROS:  Pertinent items are noted in HPI.  Otherwise, a comprehensive ROS was negative.  Exam:   LMP 11/28/2010 (Exact Date)    Ht Readings from Last 3 Encounters:  12/29/14 5' 4.25" (1.632 m)  11/14/13 5\' 4"  (1.626 m)  10/16/12 5\' 4"  (1.626 m)    General appearance: alert, cooperative and appears stated age Head: Normocephalic, without obvious abnormality, atraumatic Neck: no adenopathy, supple, symmetrical, trachea midline and thyroid normal to inspection and palpation Lungs: clear to auscultation bilaterally Breasts: normal appearance, no masses or tenderness Heart: regular rate and rhythm Abdomen: soft, non-tender; no masses,  no organomegaly Extremities: extremities normal, atraumatic, no cyanosis or edema Skin: Skin color, texture, turgor normal. No rashes or lesions Lymph nodes: Cervical, supraclavicular, and axillary nodes normal.  No abnormal inguinal nodes palpated Neurologic: Grossly normal   Pelvic: External genitalia:  no lesions              Urethra:  normal appearing urethra with no masses, tenderness or lesions              Bartholin's and Skene's: normal                 Vagina: normal appearing vagina with normal color and discharge, no lesions              Cervix: absent              Pap taken: No. Bimanual Exam:  Uterus:  uterus absent              Adnexa: no mass, fullness, tenderness               Rectovaginal: Confirms               Anus:  normal sphincter tone, no lesions  Chaperone present: no  A:   Well Woman with normal exam              S/P R-TLH secondary to fibroids 12/19/10 History of AGUS pap 2011 with negative endo biopsy History of Renal Calculi - [facility] follows                       Abnormal GFR - PCP follows History of Ureteral surgery Right - at age 70    P:   Reviewed health and wellness pertinent to exam  Pap smear as above  Mammogram is due 11/2016  Counseled on breast self exam, mammography screening, adequate intake of calcium and vitamin D, diet and exercise, Kegel's exercises return annually or prn  An After Visit Summary was printed and given to the patient.

## 2016-07-03 IMAGING — CT CT CHEST W/O CM
3 of 4 series · 17 of 30 positions shown, 19 images · non-contrast
Comparison: CT abdomen and pelvis including lung bases April 08, 2015

CLINICAL DATA: Pulmonary nodule

EXAM:
CT CHEST WITHOUT CONTRAST
TECHNIQUE: Multidetector CT imaging of the chest was performed following the
standard protocol without IV contrast.

[Series 3: chest w/o · axial · non-contrast · 0.70mm/px · z∈[-240,-60]mm · 4 of 62 slices shown]
[im 13/62  lung]
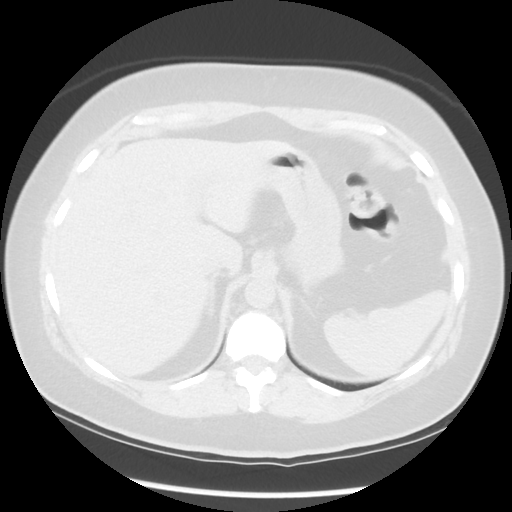
[im 25/62  lung]
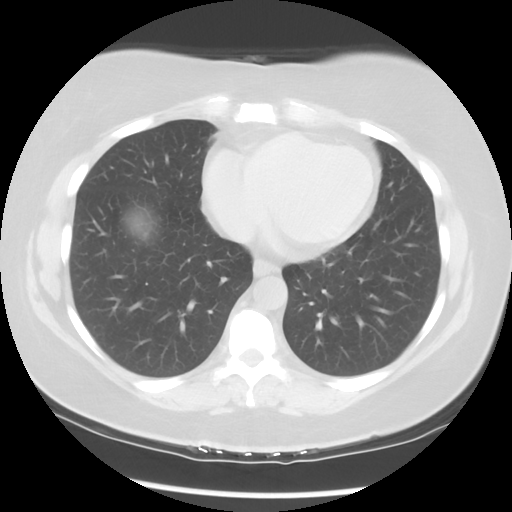
[im 37/62  lung]
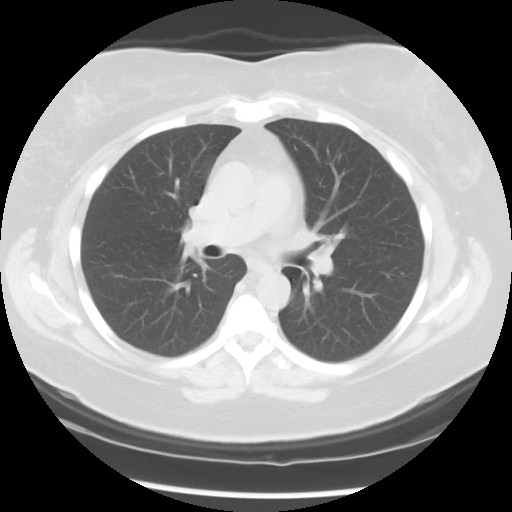
[im 49/62  lung]
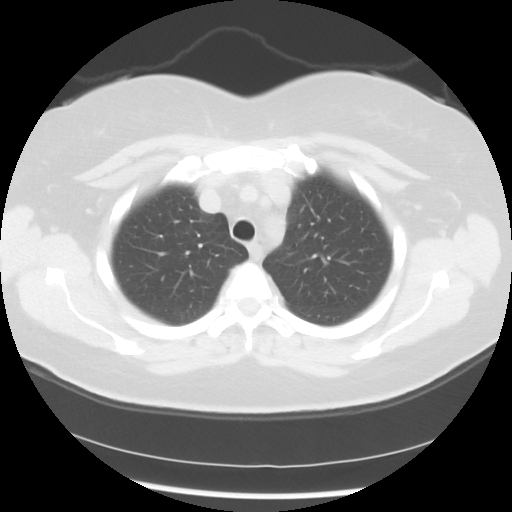

[Series 4: lung windows · axial · 0.70mm/px · z∈[-250,-45]mm · 5 of 63 slices shown, 7 images]
[im 11/63  mediastinal]
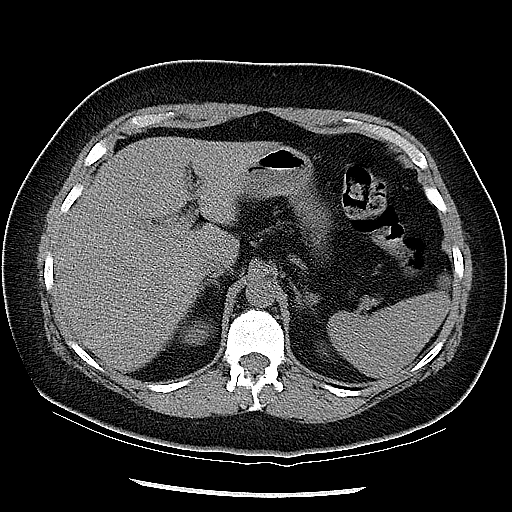
[im 11/63  lung]
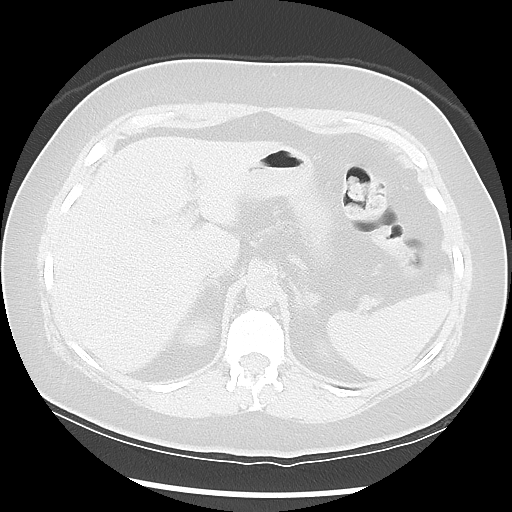
[im 21/63  lung]
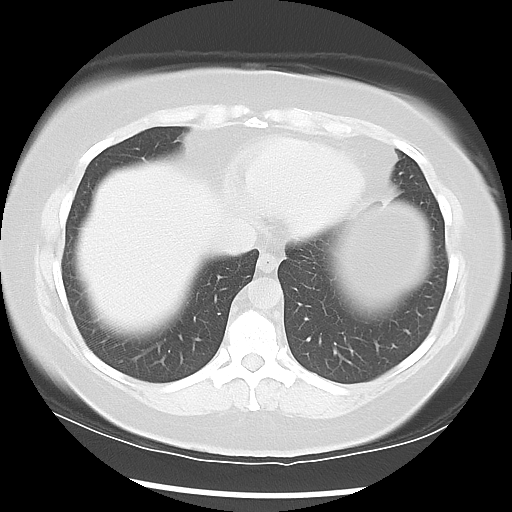
[im 32/63  lung]
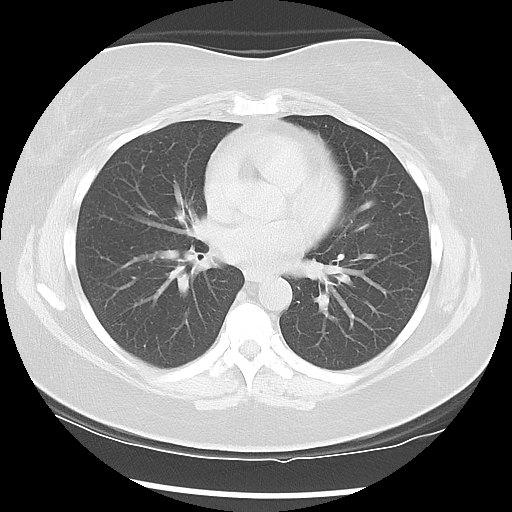
[im 42/63  lung]
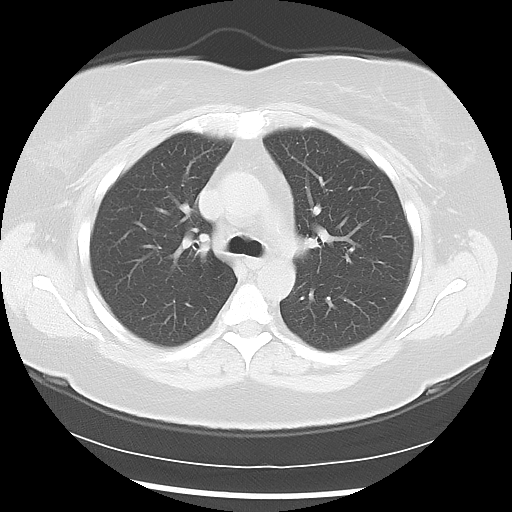
[im 52/63  mediastinal]
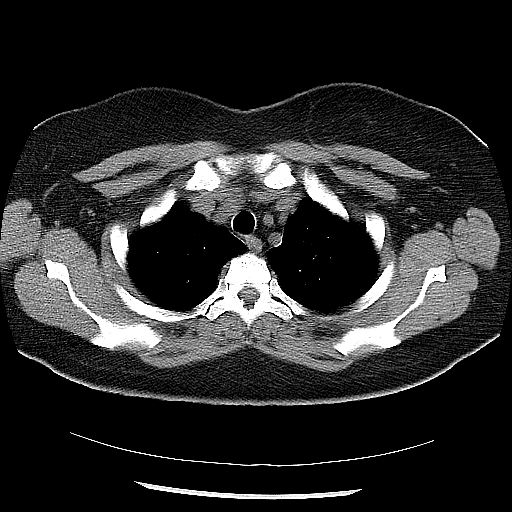
[im 52/63  lung]
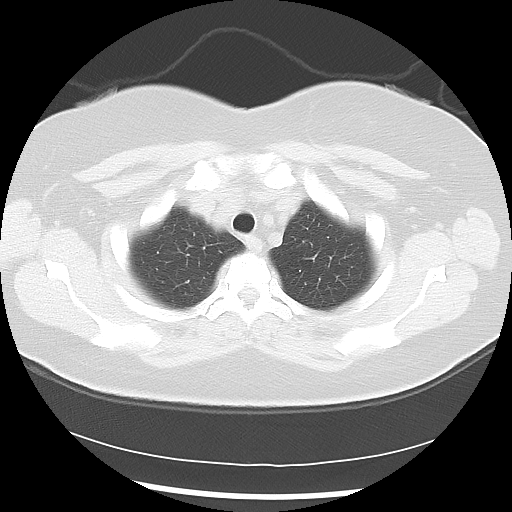

[Series 602: sagittal body · sagittal · 0.70mm/px · 8 of 145 slices shown]
[im 11/145  mediastinal]
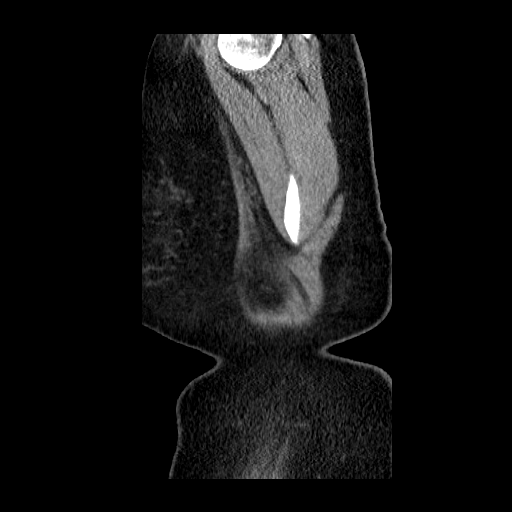
[im 31/145  mediastinal]
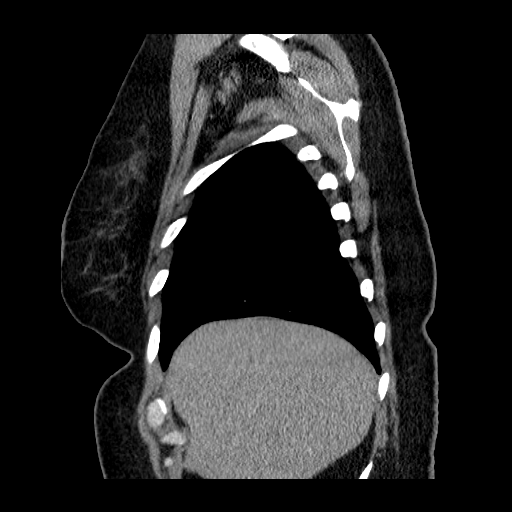
[im 52/145  mediastinal]
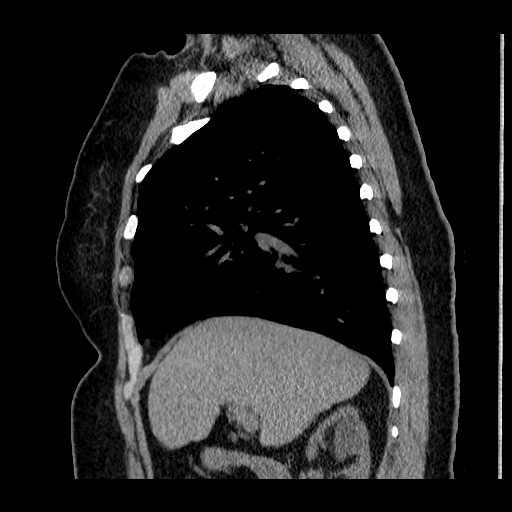
[im 62/145  mediastinal]
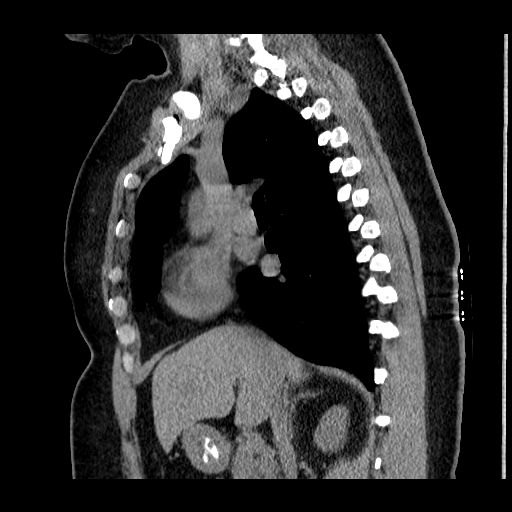
[im 83/145  mediastinal]
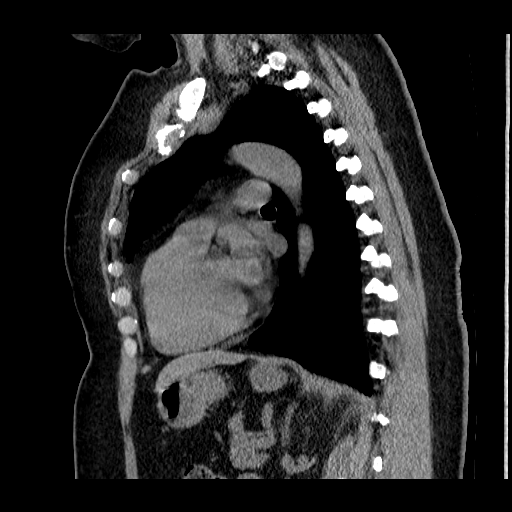
[im 93/145  mediastinal]
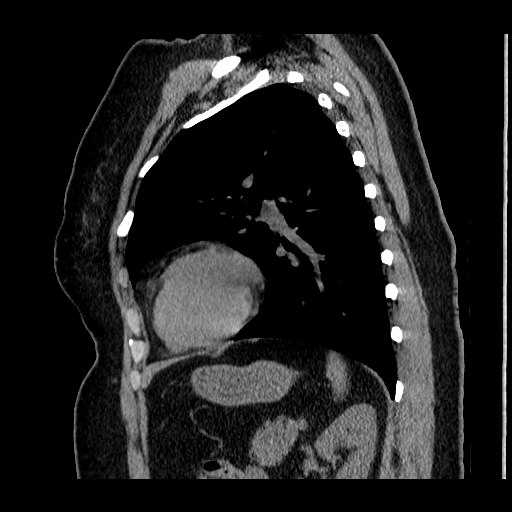
[im 114/145  mediastinal]
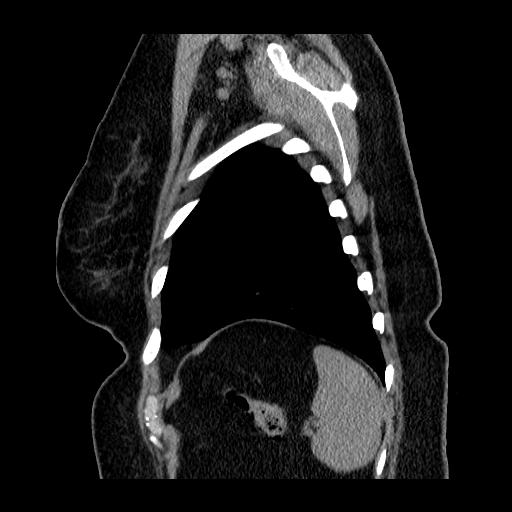
[im 134/145  mediastinal]
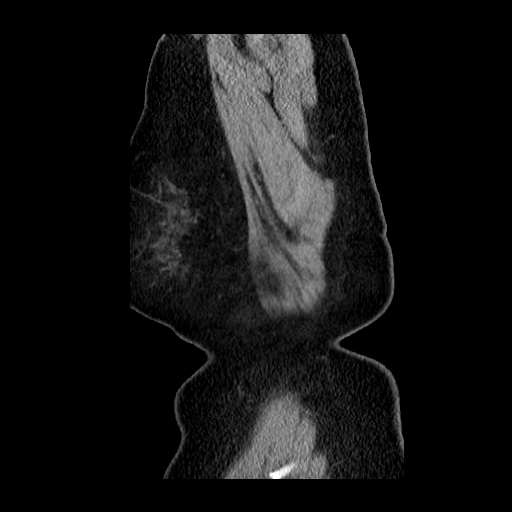

[17 of 30 positions shown; findings below may reference images not displayed]

FINDINGS: Mediastinum/Lymph Nodes: Thyroid appears normal. There is no
appreciable thoracic adenopathy. There is no demonstrable thoracic
aortic aneurysm. The visualized great vessels appear normal on this
noncontrast enhanced study. The pericardium is not thickened.

Lungs/Pleura: The previously noted 6 mm nodular opacity in the
lateral segment of the left lower lobe is no longer appreciated. On
the current examination, there is a 2 mm nodular opacity in the
medial segment right middle lobe seen on axial slice 37 series 4. On
axial slice 37 series 4, there is a 2 mm nodular opacity in the
periphery of the lateral segment of the left lower lobe. These small
nodular opacities were present on the prior study. Lungs elsewhere
clear. There is no abnormal pleural thickening.

Upper abdomen: There is scarring in the upper pole right kidney
region, stable. Fullness of the right renal collecting system is
incompletely visualized, stable in appearance from prior study.
Visualized upper abdominal structures otherwise appear unremarkable.

Musculoskeletal: No blastic or lytic bone lesions.
IMPRESSION: The previously noted 6 mm nodular opacity in the lateral left base
is no longer appreciable. There is a 2 mm nodular opacity in the
periphery of the lateral segment left lower lobe as well as a 2 mm
nodular opacity in the right middle lobe, both present on prior
study and stable. No new nodular opacities are identified in the
lungs. No edema or consolidation. No adenopathy.

Scarring right kidney with fullness of the visualized right renal
collecting system is stable compared to prior study from 1 year
prior.

## 2017-01-02 ENCOUNTER — Ambulatory Visit: Payer: BC Managed Care – PPO | Admitting: Nurse Practitioner

## 2017-01-03 ENCOUNTER — Encounter: Payer: Self-pay | Admitting: Obstetrics and Gynecology

## 2017-01-03 ENCOUNTER — Ambulatory Visit (INDEPENDENT_AMBULATORY_CARE_PROVIDER_SITE_OTHER): Payer: BC Managed Care – PPO | Admitting: Obstetrics and Gynecology

## 2017-01-03 VITALS — BP 120/70 | HR 72 | Resp 16 | Wt 190.0 lb

## 2017-01-03 DIAGNOSIS — Z01419 Encounter for gynecological examination (general) (routine) without abnormal findings: Secondary | ICD-10-CM

## 2017-01-03 DIAGNOSIS — Z90710 Acquired absence of both cervix and uterus: Secondary | ICD-10-CM

## 2017-01-03 NOTE — Patient Instructions (Signed)

## 2017-01-03 NOTE — Progress Notes (Signed)
56 y.o. [id number] MarriedCaucasianF here for annual exam.  H/O robotic assisted LVH for fibroids. She still has her ovaries. Just went to a funeral for a friend who had ovarian cancer. No vaginal bleeding, sexually active, no dyspareunia.  Husband with a h/o prostate cancer, 10 years cancer free, doing well.    Patient's last menstrual period was 11/28/2010 (exact date).          Sexually active: Yes.    The current method of family planning is status post hysterectomy.    Exercising: Yes.    walking/ yoga/ weights/ swimming  Smoker:  no  Health Maintenance: Pap:  11-14-13 WNL NEG HR HPV 09-06-10 WNL History of abnormal Pap: yes years ago  MMG:  12-06-15 WNL, had it on August 3rd at [facility], normal. Colonoscopy:  2013 Normal per patient BMD:   Never TDaP:  10-16-11 Gardasil: N/A   reports that she has never smoked. She has never used smokeless tobacco. She reports that she drinks about 0.5 oz of alcohol per week . She reports that she does not use drugs. She is [place].  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 2 grown children, no grand children. Oldest son is a Pharmacist, hospital, wants to move out of state. Youngest son is married and local.   Past Medical History:  Diagnosis Date  . Anemia   . Anxiety   . Asymptomatic bacteriuria   . Blood transfusion    32 yrs ago  . History of kidney stones   . Hypertension   . UTI (lower urinary tract infection)    recurrent    Past Surgical History:  Procedure Laterality Date  . APPENDECTOMY  1972  . ENDOMETRIAL BIOPSY  08/22/2001  . ROBOTIC ASSISTED LAPAROSCOPIC VAGINAL HYSTERECTOMY WITH FIBROID REMOVAL  12/19/10   Ovaries remain  . URETER SURGERY  1980    Current Outpatient Prescriptions  Medication Sig Dispense Refill  . Amlodipine-Valsartan-HCTZ 10-320-25 MG TABS Take 0.5 tablets by mouth every morning.   5  . Coenzyme Q10 (CO Q 10 PO) Take 300 mg by mouth daily.    . Multiple Vitamins-Minerals (MULTIVITAMIN WITH MINERALS) tablet Take 1 tablet by  mouth daily.      . pravastatin (PRAVACHOL) 40 MG tablet Take 40 mg by mouth daily.     No current facility-administered medications for this visit.     Family History  Problem Relation Age of Onset  . Diabetes Father   . Hypertension Father   . Hyperlipidemia Sister   . Hypertension Sister   . Hyperlipidemia Sister   . Hypertension Sister   . Hyperlipidemia Sister   . Hypertension Sister   . Hyperlipidemia Sister   . Hypertension Sister     Review of Systems  Constitutional: Negative.   HENT: Negative.   Eyes: Negative.   Respiratory: Negative.   Cardiovascular: Negative.   Gastrointestinal: Negative.   Endocrine: Negative.   Genitourinary: Negative.   Musculoskeletal: Negative.   Skin: Negative.   Allergic/Immunologic: Negative.   Neurological: Negative.   Psychiatric/Behavioral: Negative.     Exam:   BP 120/70 (BP Location: Right Arm, Patient Position: Sitting, Cuff Size: Normal)   Pulse 72   Resp 16   Wt 190 lb (86.2 kg)   LMP 11/28/2010 (Exact Date)   BMI 32.36 kg/m   Weight change: @WEIGHTCHANGE @ Height:      Ht Readings from Last 3 Encounters:  12/31/15 5' 4.25" (1.632 m)  12/29/14 5' 4.25" (1.632 m)  11/14/13 5\' 4"  (  1.626 m)    General appearance: alert, cooperative and appears stated age Head: Normocephalic, without obvious abnormality, atraumatic Neck: no adenopathy, supple, symmetrical, trachea midline and thyroid normal to inspection and palpation Lungs: clear to auscultation bilaterally Cardiovascular: regular rate and rhythm Breasts: normal appearance, no masses or tenderness Abdomen: soft, non-tender; bowel sounds normal; no masses,  no organomegaly Extremities: extremities normal, atraumatic, no cyanosis or edema Skin: Skin color, texture, turgor normal. No rashes or lesions Lymph nodes: Cervical, supraclavicular, and axillary nodes normal. No abnormal inguinal nodes palpated Neurologic: Grossly normal   Pelvic: External genitalia:  no  lesions              Urethra:  normal appearing urethra with no masses, tenderness or lesions              Bartholins and Skenes: normal                 Vagina: normal appearing atrophic vagina with normal color and discharge, no lesions              Cervix: absent               Bimanual Exam:  Uterus:  uterus absent              Adnexa: no mass, fullness, tenderness               Rectovaginal: Confirms               Anus:  normal sphincter tone, no lesions  Chaperone was present for exam.  A:  Well Woman with normal exam  P:   Labs with primary  Mammogram just done  Colonoscopy UTD  Discussed breast self exam  Discussed calcium and vit D intake  No pap needed

## 2017-01-30 ENCOUNTER — Encounter: Payer: Self-pay | Admitting: Obstetrics and Gynecology

## 2017-12-31 ENCOUNTER — Encounter: Payer: Self-pay | Admitting: Obstetrics and Gynecology

## 2018-01-04 NOTE — Progress Notes (Signed)
57 y.o. G3T5176 MarriedCaucasianF here for annual exam.  H/o hysterectomy for fibroids. No vaginal bleeding. No dyspareunia.  She is doing better not over working. Plans to retire in 2022.  Husband is doing well (h/o prostate cancer 12 years ago).     Patient's last menstrual period was 11/28/2010 (exact date).          Sexually active: Yes.    The current method of family planning is status post hysterectomy.    Exercising: Yes.    cardio, swimming, yoga Smoker:  no  Health Maintenance: Pap:  11/14/2013 negative History of abnormal Pap:  no MMG: 12/14/2017  BIRADS 1 Negative Colonoscopy:  2012 normal, 10 year f/u per patient BMD:   never TDaP:  2013 Gardasil: n/a   reports that she has never smoked. She has never used smokeless tobacco. She reports that she drinks about 1.0 standard drinks of alcohol per week. She reports that she does not use drugs.  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 She is from [place], has family there. Oldest son is in [place] with his girlfriend. Plans to get his PhD in social justice. Other son is local, married, no children (has had 2 losses).   Past Medical History:  Diagnosis Date  . Anemia   . Anxiety   . Asymptomatic bacteriuria   . Blood transfusion    32 yrs ago  . History of kidney stones   . Hypertension   . UTI (lower urinary tract infection)    recurrent    Past Surgical History:  Procedure Laterality Date  . APPENDECTOMY  1972  . ENDOMETRIAL BIOPSY  08/22/2001  . ROBOTIC ASSISTED LAPAROSCOPIC VAGINAL HYSTERECTOMY WITH FIBROID REMOVAL  12/19/10   Ovaries remain  . URETER SURGERY  1980    Current Outpatient Medications  Medication Sig Dispense Refill  . Amlodipine-Valsartan-HCTZ 10-320-25 MG TABS Take 0.5 tablets by mouth every morning.   5  . Coenzyme Q10 (CO Q 10 PO) Take 300 mg by mouth daily.    . Multiple Vitamins-Minerals (MULTIVITAMIN WITH MINERALS) tablet Take 1 tablet by mouth daily.      . pravastatin  (PRAVACHOL) 40 MG tablet Take 40 mg by mouth daily.     No current facility-administered medications for this visit.     Family History  Problem Relation Age of Onset  . Diabetes Father   . Hypertension Father   . Hyperlipidemia Sister   . Hypertension Sister   . Hyperlipidemia Sister   . Hypertension Sister   . Hyperlipidemia Sister   . Hypertension Sister   . Hyperlipidemia Sister   . Hypertension Sister     Review of Systems  Constitutional: Negative.   HENT: Negative.   Eyes: Negative.   Respiratory: Negative.   Cardiovascular: Negative.   Gastrointestinal: Negative.   Endocrine: Negative.   Genitourinary: Negative.   Musculoskeletal: Negative.   Skin: Negative.   Allergic/Immunologic: Negative.   Neurological: Negative.   Hematological: Negative.   Psychiatric/Behavioral: Negative.   All other systems reviewed and are negative.   Exam:   BP 120/62 (BP Location: Right Arm, Patient Position: Sitting, Cuff Size: Normal)   Pulse 76   Resp 14   Ht 5' 4.25" (1.632 m)   Wt 190 lb (86.2 kg)   LMP 11/28/2010 (Exact Date)   BMI 32.36 kg/m   Weight change: @WEIGHTCHANGE @ Height:   Height: 5' 4.25" (163.2 cm)  Ht Readings from Last 3 Encounters:  01/09/18 5' 4.25" (1.632 m)  12/31/15 5' 4.25" (1.632 m)  12/29/14 5' 4.25" (1.632 m)    General appearance: alert, cooperative and appears stated age Head: Normocephalic, without obvious abnormality, atraumatic Neck: no adenopathy, supple, symmetrical, trachea midline and thyroid normal to inspection and palpation Lungs: clear to auscultation bilaterally Cardiovascular: regular rate and rhythm Breasts: normal appearance, no masses or tenderness Abdomen: soft, non-tender; non distended,  no masses,  no organomegaly Extremities: extremities normal, atraumatic, no cyanosis or edema Skin: Skin color, texture, turgor normal. No rashes or lesions Lymph nodes: Cervical, supraclavicular, and axillary nodes normal. No abnormal  inguinal nodes palpated Neurologic: Grossly normal   Pelvic: External genitalia:  no lesions              Urethra:  normal appearing urethra with no masses, tenderness or lesions              Bartholins and Skenes: normal                 Vagina: normal appearing vagina with normal color and discharge, no lesions              Cervix: absent               Bimanual Exam:  Uterus:  uterus absent              Adnexa: no mass, fullness, tenderness               Rectovaginal: Confirms               Anus:  normal sphincter tone, no lesions  Chaperone was present for exam.  A:  Well Woman with normal exam  P:   No pap needed  Labs with primary MD  Mammogram UTD  Colonoscopy UTD  Discussed breast self exam  Discussed calcium and vit D intake

## 2018-01-09 ENCOUNTER — Other Ambulatory Visit: Payer: Self-pay

## 2018-01-09 ENCOUNTER — Encounter: Payer: Self-pay | Admitting: Obstetrics and Gynecology

## 2018-01-09 ENCOUNTER — Ambulatory Visit (INDEPENDENT_AMBULATORY_CARE_PROVIDER_SITE_OTHER): Payer: BC Managed Care – PPO | Admitting: Obstetrics and Gynecology

## 2018-01-09 VITALS — BP 120/62 | HR 76 | Resp 14 | Ht 64.25 in | Wt 190.0 lb

## 2018-01-09 DIAGNOSIS — Z01419 Encounter for gynecological examination (general) (routine) without abnormal findings: Secondary | ICD-10-CM

## 2018-01-09 NOTE — Patient Instructions (Signed)
EXERCISE AND DIET:  We recommended that you start or continue a regular exercise program for good health. Regular exercise means any activity that makes your heart beat faster and makes you sweat.  We recommend exercising at least 30 minutes per day at least 3 days a week, preferably 4 or 5.  We also recommend a diet low in fat and sugar.  Inactivity, poor dietary choices and obesity can cause diabetes, heart attack, stroke, and kidney damage, among others.    ALCOHOL AND SMOKING:  Women should limit their alcohol intake to no more than 7 drinks/beers/glasses of wine (combined, not each!) per week. Moderation of alcohol intake to this level decreases your risk of breast cancer and liver damage. And of course, no recreational drugs are part of a healthy lifestyle.  And absolutely no smoking or even second hand smoke. Most people know smoking can cause heart and lung diseases, but did you know it also contributes to weakening of your bones? Aging of your skin?  Yellowing of your teeth and nails?  CALCIUM AND VITAMIN D:  Adequate intake of calcium and Vitamin D are recommended.  The recommendations for exact amounts of these supplements seem to change often, but generally speaking 600 mg of calcium (either carbonate or citrate) and 800 units of Vitamin D per day seems prudent. Certain women may benefit from higher intake of Vitamin D.  If you are among these women, your doctor will have told you during your visit.    PAP SMEARS:  Pap smears, to check for cervical cancer or precancers,  have traditionally been done yearly, although recent scientific advances have shown that most women can have pap smears less often.  However, every woman still should have a physical exam from her gynecologist every year. It will include a breast check, inspection of the vulva and vagina to check for abnormal growths or skin changes, a visual exam of the cervix, and then an exam to evaluate the size and shape of the uterus and  ovaries.  And after 57 years of age, a rectal exam is indicated to check for rectal cancers. We will also provide age appropriate advice regarding health maintenance, like when you should have certain vaccines, screening for sexually transmitted diseases, bone density testing, colonoscopy, mammograms, etc.   MAMMOGRAMS:  All women over 40 years old should have a yearly mammogram. Many facilities now offer a "3D" mammogram, which may cost around $50 extra out of pocket. If possible,  we recommend you accept the option to have the 3D mammogram performed.  It both reduces the number of women who will be called back for extra views which then turn out to be normal, and it is better than the routine mammogram at detecting truly abnormal areas.    COLONOSCOPY:  Colonoscopy to screen for colon cancer is recommended for all women at age 50.  We know, you hate the idea of the prep.  We agree, BUT, having colon cancer and not knowing it is worse!!  Colon cancer so often starts as a polyp that can be seen and removed at colonscopy, which can quite literally save your life!  And if your first colonoscopy is normal and you have no family history of colon cancer, most women don't have to have it again for 10 years.  Once every ten years, you can do something that may end up saving your life, right?  We will be happy to help you get it scheduled when you are ready.    Be sure to check your insurance coverage so you understand how much it will cost.  It may be covered as a preventative service at no cost, but you should check your particular policy.      Breast Self-Awareness Breast self-awareness means being familiar with how your breasts look and feel. It involves checking your breasts regularly and reporting any changes to your health care provider. Practicing breast self-awareness is important. A change in your breasts can be a sign of a serious medical problem. Being familiar with how your breasts look and feel allows  you to find any problems early, when treatment is more likely to be successful. All women should practice breast self-awareness, including women who have had breast implants. How to do a breast self-exam One way to learn what is normal for your breasts and whether your breasts are changing is to do a breast self-exam. To do a breast self-exam: Look for Changes  1. Remove all the clothing above your waist. 2. Stand in front of a mirror in a room with good lighting. 3. Put your hands on your hips. 4. Push your hands firmly downward. 5. Compare your breasts in the mirror. Look for differences between them (asymmetry), such as: ? Differences in shape. ? Differences in size. ? Puckers, dips, and bumps in one breast and not the other. 6. Look at each breast for changes in your skin, such as: ? Redness. ? Scaly areas. 7. Look for changes in your nipples, such as: ? Discharge. ? Bleeding. ? Dimpling. ? Redness. ? A change in position. Feel for Changes  Carefully feel your breasts for lumps and changes. It is best to do this while lying on your back on the floor and again while sitting or standing in the shower or tub with soapy water on your skin. Feel each breast in the following way:  Place the arm on the side of the breast you are examining above your head.  Feel your breast with the other hand.  Start in the nipple area and make  inch (2 cm) overlapping circles to feel your breast. Use the pads of your three middle fingers to do this. Apply light pressure, then medium pressure, then firm pressure. The light pressure will allow you to feel the tissue closest to the skin. The medium pressure will allow you to feel the tissue that is a little deeper. The firm pressure will allow you to feel the tissue close to the ribs.  Continue the overlapping circles, moving downward over the breast until you feel your ribs below your breast.  Move one finger-width toward the center of the body.  Continue to use the  inch (2 cm) overlapping circles to feel your breast as you move slowly up toward your collarbone.  Continue the up and down exam using all three pressures until you reach your armpit.  Write Down What You Find  Write down what is normal for each breast and any changes that you find. Keep a written record with breast changes or normal findings for each breast. By writing this information down, you do not need to depend only on memory for size, tenderness, or location. Write down where you are in your menstrual cycle, if you are still menstruating. If you are having trouble noticing differences in your breasts, do not get discouraged. With time you will become more familiar with the variations in your breasts and more comfortable with the exam. How often should I examine my breasts? Examine   your breasts every month. If you are breastfeeding, the best time to examine your breasts is after a feeding or after using a breast pump. If you menstruate, the best time to examine your breasts is 5-7 days after your period is over. During your period, your breasts are lumpier, and it may be more difficult to notice changes. When should I see my health care provider? See your health care provider if you notice:  A change in shape or size of your breasts or nipples.  A change in the skin of your breast or nipples, such as a reddened or scaly area.  Unusual discharge from your nipples.  A lump or thick area that was not there before.  Pain in your breasts.  Anything that concerns you.  This information is not intended to replace advice given to you by your health care provider. Make sure you discuss any questions you have with your health care provider. Document Released: 04/24/2005 Document Revised: 09/30/2015 Document Reviewed: 03/14/2015 Elsevier Interactive Patient Education  2018 [employer]  

## 2019-01-14 ENCOUNTER — Other Ambulatory Visit: Payer: Self-pay

## 2019-01-14 NOTE — Progress Notes (Signed)
58 y.o. G24P2002 Married White or Caucasian Not Hispanic or Latino female here for annual exam. H/O hysterectomy for fibroids. Sexually active, no pain.     Husband with distant h/o prostate cancer.   She is under stress, days that she is blue, not depressed. Husband is supportive. Husband is retiring in 12/20, she hopes to retire at the end of next year.     Patient's last menstrual period was 11/28/2010 (exact date).          Sexually active: Yes.    The current method of family planning is status post hysterectomy.    Exercising: Yes.    walking Smoker:  no  Health Maintenance: Pap:  11/14/2013 WNL NEG HPV History of abnormal Pap:  no MMG: 12/14/2017  BIRADS 1 Negative, patient reports she had MMG at [facility] on 01/14/2019, normal per patient Colonoscopy:  2012 normal, 10 year f/u per patient BMD:   never TDaP:  2013 Gardasil: n/a     reports that she has never smoked. She has never used smokeless tobacco. She reports current alcohol use of about 1.0 standard drinks of alcohol per week. She reports that she does not use drugs. 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 2 grown sons. She is from [place], has family there. Oldest son is in [place] with his girlfriend. Plans to get his PhD in social justice. Other son is local, married. Daughter in law is pregnant, due in 11/20 (boy) 5 deaths in the family since May, 2020.  Past Medical History:  Diagnosis Date  . Anemia   . Anxiety   . Asymptomatic bacteriuria   . Blood transfusion    32 yrs ago  . Elevated cholesterol   . History of kidney stones   . Hypertension   . UTI (lower urinary tract infection)    recurrent    Past Surgical History:  Procedure Laterality Date  . APPENDECTOMY  1972  . ENDOMETRIAL BIOPSY  08/22/2001  . ROBOTIC ASSISTED LAPAROSCOPIC VAGINAL HYSTERECTOMY WITH FIBROID REMOVAL  12/19/10   Ovaries remain  . URETER SURGERY  1980    Current Outpatient Medications  Medication Sig Dispense  Refill  . Amlodipine-Valsartan-HCTZ 10-320-25 MG TABS Take 0.5 tablets by mouth every morning.   5  . Ascorbic Acid (VITAMIN C PO) Take by mouth.    . Coenzyme Q10 (CO Q 10 PO) Take 300 mg by mouth daily.    Marland Kitchen ELDERBERRY PO Take by mouth.    Marland Kitchen MAGNESIUM PO Take by mouth.    . Multiple Vitamins-Minerals (MULTIVITAMIN WITH MINERALS) tablet Take 1 tablet by mouth daily.      . pravastatin (PRAVACHOL) 40 MG tablet Take 40 mg by mouth daily.    Marland Kitchen VITAMIN D PO Take by mouth.     No current facility-administered medications for this visit.     Family History  Problem Relation Age of Onset  . Diabetes Father   . Hypertension Father   . Hyperlipidemia Sister   . Hypertension Sister   . Hyperlipidemia Sister   . Hypertension Sister   . Hyperlipidemia Sister   . Hypertension Sister   . Hyperlipidemia Sister   . Hypertension Sister     Review of Systems  Constitutional: Negative.   HENT: Negative.   Eyes: Negative.   Respiratory: Negative.   Cardiovascular: Negative.   Gastrointestinal: Negative.   Endocrine: Negative.   Genitourinary: Negative.   Musculoskeletal: Negative.   Skin: Negative.   Allergic/Immunologic: Negative.  Neurological: Negative.   Hematological: Negative.   Psychiatric/Behavioral:       Stressed    Exam:   BP 114/62 (BP Location: Right Arm, Patient Position: Sitting, Cuff Size: Normal)   Pulse 80   Temp (!) 97.1 F (36.2 C) (Skin)   Ht 5\' 4"  (1.626 m)   Wt 192 lb 6.4 oz (87.3 kg)   LMP 11/28/2010 (Exact Date)   BMI 33.03 kg/m   Weight change: @WEIGHTCHANGE @ Height:   Height: 5\' 4"  (162.6 cm)  Ht Readings from Last 3 Encounters:  01/16/19 5\' 4"  (1.626 m)  01/09/18 5' 4.25" (1.632 m)  12/31/15 5' 4.25" (1.632 m)    General appearance: alert, cooperative and appears stated age Head: Normocephalic, without obvious abnormality, atraumatic Neck: no adenopathy, supple, symmetrical, trachea midline and thyroid normal to inspection and palpation Lungs:  clear to auscultation bilaterally Cardiovascular: regular rate and rhythm Breasts: normal appearance, no masses or tenderness Abdomen: soft, non-tender; non distended,  no masses,  no organomegaly Extremities: extremities normal, atraumatic, no cyanosis or edema Skin: Skin color, texture, turgor normal. No rashes or lesions Lymph nodes: Cervical, supraclavicular, and axillary nodes normal. No abnormal inguinal nodes palpated Neurologic: Grossly normal   Pelvic: External genitalia:  no lesions              Urethra:  normal appearing urethra with no masses, tenderness or lesions              Bartholins and Skenes: normal                 Vagina: normal appearing vagina with normal color and discharge, no lesions              Cervix: absent               Bimanual Exam:  Uterus:  uterus absent              Adnexa: no mass, fullness, tenderness               Rectovaginal: Confirms               Anus:  normal sphincter tone, no lesions  Chaperone was present for exam.  A:  Well Woman with normal exam  H/O hysterectomy  P:   No pap needed  Mammogram just done  Colonoscopy is UTD  Labs with primary  Discussed breast self exam  Discussed calcium and vit D intake

## 2019-01-16 ENCOUNTER — Ambulatory Visit (INDEPENDENT_AMBULATORY_CARE_PROVIDER_SITE_OTHER): Payer: BC Managed Care – PPO | Admitting: Obstetrics and Gynecology

## 2019-01-16 ENCOUNTER — Other Ambulatory Visit: Payer: Self-pay

## 2019-01-16 ENCOUNTER — Encounter: Payer: Self-pay | Admitting: Obstetrics and Gynecology

## 2019-01-16 VITALS — BP 114/62 | HR 80 | Temp 97.1°F | Ht 64.0 in | Wt 192.4 lb

## 2019-01-16 DIAGNOSIS — Z01419 Encounter for gynecological examination (general) (routine) without abnormal findings: Secondary | ICD-10-CM

## 2019-01-16 DIAGNOSIS — Z90710 Acquired absence of both cervix and uterus: Secondary | ICD-10-CM | POA: Diagnosis not present

## 2019-01-16 DIAGNOSIS — E7800 Pure hypercholesterolemia, unspecified: Secondary | ICD-10-CM | POA: Insufficient documentation

## 2019-01-16 NOTE — Patient Instructions (Signed)

## 2020-01-20 NOTE — Progress Notes (Signed)
59 y.o. G24P2002 Married White or Caucasian Not Hispanic or Latino female here for annual exam.  Sexually active, no pain. No bowel or bladder c/o.   On meds for HTN, her GFR has been down to 51. Medication has been adjusted, she is hydrating well.   H/O hysterectomy for fibroids.   Husband with distant h/o prostate cancer.  Enjoying her 26 month old grandson.   She is retiring in June, 2022. Husband retiring in a few months.   She has lost 7 family members (in laws) in 1.5 years, no one from covid, all MI's.     Patient's last menstrual period was 11/28/2010 (exact date).          Sexually active: Yes.    The current method of family planning is status post hysterectomy.    Exercising: Yes.    Walking  Smoker:  no  Health Maintenance: Pap:  11/14/2013 WNL NEG HPV History of abnormal Pap:  no MMG:  Has Mammogram tomorrow  BMD:   Never  Colonoscopy: 2012 normal, 10 year f/u per patient TDaP:  2013 Gardasil: Na   reports that she has never smoked. She has never used smokeless tobacco. She reports current alcohol use of about 1.0 standard drink of alcohol per week. She reports that she does not use drugs. 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 She is from [place], has family there. Oldest son is in [place] with his girlfriend. Plans to get his PhD in social justice. Other son is local, married. Has a 61 month old baby boy.  Past Medical History:  Diagnosis Date  . Anemia   . Anxiety   . Asymptomatic bacteriuria   . Blood transfusion    32 yrs ago  . Elevated cholesterol   . History of kidney stones   . Hypertension   . UTI (lower urinary tract infection)    recurrent    Past Surgical History:  Procedure Laterality Date  . APPENDECTOMY  1972  . ENDOMETRIAL BIOPSY  08/22/2001  . ROBOTIC ASSISTED LAPAROSCOPIC VAGINAL HYSTERECTOMY WITH FIBROID REMOVAL  12/19/10   Ovaries remain  . URETER SURGERY  1980    Current Outpatient Medications  Medication Sig  Dispense Refill  . amLODipine (NORVASC) 10 MG tablet Take 10 mg by mouth daily.    . Ascorbic Acid (VITAMIN C PO) Take by mouth.    . Coenzyme Q10 (CO Q 10 PO) Take 300 mg by mouth daily.    Marland Kitchen ELDERBERRY PO Take by mouth.    Marland Kitchen MAGNESIUM PO Take by mouth.    . Multiple Vitamins-Minerals (MULTIVITAMIN WITH MINERALS) tablet Take 1 tablet by mouth daily.      . pravastatin (PRAVACHOL) 40 MG tablet Take 40 mg by mouth daily.    . valsartan (DIOVAN) 320 MG tablet Take 320 mg by mouth daily.    Marland Kitchen VITAMIN D PO Take by mouth.     No current facility-administered medications for this visit.    Family History  Problem Relation Age of Onset  . Diabetes Father   . Hypertension Father   . Hyperlipidemia Sister   . Hypertension Sister   . Hyperlipidemia Sister   . Hypertension Sister   . Hyperlipidemia Sister   . Hypertension Sister   . Hyperlipidemia Sister   . Hypertension Sister     Review of Systems  All other systems reviewed and are negative.   Exam:   BP (!) 142/74   Pulse 80  Ht 5\' 4"  (1.626 m)   Wt 185 lb 3.2 oz (84 kg)   LMP 11/28/2010 (Exact Date)   SpO2 100%   BMI 31.79 kg/m   Weight change: @WEIGHTCHANGE @ Height:   Height: 5\' 4"  (162.6 cm)  Ht Readings from Last 3 Encounters:  01/21/20 5\' 4"  (1.626 m)  01/16/19 5\' 4"  (1.626 m)  01/09/18 5' 4.25" (1.632 m)    General appearance: alert, cooperative and appears stated age Head: Normocephalic, without obvious abnormality, atraumatic Neck: no adenopathy, supple, symmetrical, trachea midline and thyroid normal to inspection and palpation Lungs: clear to auscultation bilaterally Cardiovascular: regular rate and rhythm Breasts: normal appearance, no masses or tenderness Abdomen: soft, non-tender; non distended,  no masses,  no organomegaly Extremities: extremities normal, atraumatic, no cyanosis or edema Skin: Skin color, texture, turgor normal. No rashes or lesions Lymph nodes: Cervical, supraclavicular, and axillary  nodes normal. No abnormal inguinal nodes palpated Neurologic: Grossly normal   Pelvic: External genitalia:  no lesions              Urethra:  normal appearing urethra with no masses, tenderness or lesions              Bartholins and Skenes: normal                 Vagina: normal appearing vagina with normal color and discharge, no lesions              Cervix: absent               Bimanual Exam:  Uterus:  uterus absent              Adnexa: no mass, fullness, tenderness               Rectovaginal: Confirms               Anus:  normal sphincter tone, no lesions  Gae Dry chaperoned for the exam.  A:  Well Woman with normal exam  H/O hysterectomy    P:   No pap needed  Mammogram tomorrow  Labs with primary  Colonoscopy UTD  Discussed breast self exam  Discussed calcium and vit D intake

## 2020-01-21 ENCOUNTER — Other Ambulatory Visit: Payer: Self-pay

## 2020-01-21 ENCOUNTER — Encounter: Payer: Self-pay | Admitting: Obstetrics and Gynecology

## 2020-01-21 ENCOUNTER — Ambulatory Visit (INDEPENDENT_AMBULATORY_CARE_PROVIDER_SITE_OTHER): Payer: BC Managed Care – PPO | Admitting: Obstetrics and Gynecology

## 2020-01-21 VITALS — BP 142/74 | HR 80 | Ht 64.0 in | Wt 185.2 lb

## 2020-01-21 DIAGNOSIS — Z01419 Encounter for gynecological examination (general) (routine) without abnormal findings: Secondary | ICD-10-CM | POA: Diagnosis not present

## 2020-01-21 DIAGNOSIS — Z90710 Acquired absence of both cervix and uterus: Secondary | ICD-10-CM | POA: Diagnosis not present

## 2020-01-21 DIAGNOSIS — N289 Disorder of kidney and ureter, unspecified: Secondary | ICD-10-CM | POA: Diagnosis not present

## 2020-01-21 NOTE — Patient Instructions (Signed)

## 2021-01-25 NOTE — Progress Notes (Signed)
60 y.o. [id number] Married Declined Declined female here for annual exam.  H/o hysterectomy for fibroids. Sexually active, no pain. No bowel or bladder c/o.   Her GFR improved recently to a normal range!  She is slowly loosing weight.     Patient's last menstrual period was 11/28/2010 (exact date).          Sexually active: Yes.    The current method of family planning is status post hysterectomy.    Exercising: Yes.     Walking and swimming  Smoker:  no  Health Maintenance: Pap:  11/14/2013 WNL NEG HPV History of abnormal Pap:  no MMG:  01/24/21: negative; 01/28/20 Bi-rads 1 neg  BMD:   never  Colonoscopy: 2013 normal, 10 year f/u per patient. Primary is scheduling it. TDaP:  10/16/11  Gardasil: n/a   reports that she has never smoked. She has never used smokeless tobacco. She reports current alcohol use of about 1.0 standard drink per week. She reports that she does not use drugs. She is from [place], has family there. 2 grown sons. Oldest son is in [place] with his girlfriend. Getting his PhD in Consulting civil engineer. Other son is local, married. Has a 55 month old baby boy. 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  Her husband retires in 12/22.   Past Medical History:  Diagnosis Date   Anemia    Anxiety    Asymptomatic bacteriuria    Blood transfusion    32 yrs ago   Elevated cholesterol    History of kidney stones    Hypertension    Renal disease    UTI (lower urinary tract infection)    recurrent    Past Surgical History:  Procedure Laterality Date   APPENDECTOMY  1972   ENDOMETRIAL BIOPSY  08/22/2001   ROBOTIC ASSISTED LAPAROSCOPIC VAGINAL HYSTERECTOMY WITH FIBROID REMOVAL  12/19/10   Ovaries remain   URETER SURGERY  1980    Current Outpatient Medications  Medication Sig Dispense Refill   amLODipine (NORVASC) 10 MG tablet Take 10 mg by mouth daily.     Ascorbic Acid (VITAMIN C PO) Take by mouth.     Coenzyme Q10 (CO Q 10 PO) Take 300  mg by mouth daily.     ELDERBERRY PO Take by mouth.     MAGNESIUM PO Take by mouth.     Multiple Vitamins-Minerals (MULTIVITAMIN WITH MINERALS) tablet Take 1 tablet by mouth daily.       pravastatin (PRAVACHOL) 40 MG tablet Take 40 mg by mouth daily.     valsartan (DIOVAN) 320 MG tablet Take 320 mg by mouth daily.     VITAMIN D PO Take by mouth.     No current facility-administered medications for this visit.    Family History  Problem Relation Age of Onset   Diabetes Father    Hypertension Father    Hyperlipidemia Sister    Hypertension Sister    Hyperlipidemia Sister    Hypertension Sister    Hyperlipidemia Sister    Hypertension Sister    Hyperlipidemia Sister    Hypertension Sister     Review of Systems  All other systems reviewed and are negative.  Exam:   BP 122/80   Pulse 75   Ht 5\' 4"  (1.626 m)   Wt 186 lb (84.4 kg)   LMP 11/28/2010 (Exact Date)   SpO2 99%   BMI 31.93 kg/m   Weight change: @WEIGHTCHANGE @ Height:   Height:  5\' 4"  (162.6 cm)  Ht Readings from Last 3 Encounters:  01/27/21 5\' 4"  (1.626 m)  01/21/20 5\' 4"  (1.626 m)  01/16/19 5\' 4"  (1.626 m)    General appearance: alert, cooperative and appears stated age Head: Normocephalic, without obvious abnormality, atraumatic Neck: no adenopathy, supple, symmetrical, trachea midline and thyroid normal to inspection and palpation Lungs: clear to auscultation bilaterally Cardiovascular: regular rate and rhythm Breasts: normal appearance, no masses or tenderness Abdomen: soft, non-tender; non distended,  no masses,  no organomegaly Extremities: extremities normal, atraumatic, no cyanosis or edema Skin: Skin color, texture, turgor normal. No rashes or lesions Lymph nodes: Cervical, supraclavicular, and axillary nodes normal. No abnormal inguinal nodes palpated Neurologic: Grossly normal   Pelvic: External genitalia:  no lesions              Urethra:  normal appearing urethra with no masses, tenderness or  lesions              Bartholins and Skenes: normal                 Vagina: mildly atrophic appearing vagina with normal color and discharge, no lesions              Cervix: absent               Bimanual Exam:  Uterus:  uterus absent              Adnexa: no mass, fullness, tenderness               Rectovaginal: Confirms               Anus:  normal sphincter tone, no lesions  Gae Dry chaperoned for the exam.  1. Well woman exam Discussed breast self exam Discussed calcium and vit D intake No pap needed Mammogram UTD Colonoscopy will be scheduled.

## 2021-01-26 ENCOUNTER — Encounter: Payer: Self-pay | Admitting: Obstetrics and Gynecology

## 2021-01-27 ENCOUNTER — Other Ambulatory Visit: Payer: Self-pay

## 2021-01-27 ENCOUNTER — Ambulatory Visit (INDEPENDENT_AMBULATORY_CARE_PROVIDER_SITE_OTHER): Payer: BC Managed Care – PPO | Admitting: Obstetrics and Gynecology

## 2021-01-27 ENCOUNTER — Encounter: Payer: Self-pay | Admitting: Obstetrics and Gynecology

## 2021-01-27 VITALS — BP 122/80 | HR 75 | Ht 64.0 in | Wt 186.0 lb

## 2021-01-27 DIAGNOSIS — Z01419 Encounter for gynecological examination (general) (routine) without abnormal findings: Secondary | ICD-10-CM | POA: Diagnosis not present

## 2021-01-27 NOTE — Patient Instructions (Signed)

## 2022-01-24 NOTE — Progress Notes (Signed)
61 y.o. [id number] Married Declined Declined female here for annual exam.  H/O hysterectomy. She has no gyn concerns. No dyspareunia.   She retired this past year and is keeping her 6 1/2 year old grandson. Jaime Ingram.    Patient's last menstrual period was 11/28/2010 (exact date).          Sexually active: Yes.    The current method of family planning is status post hysterectomy.    Exercising: Yes.     Swimming and walking  Smoker:  no  Health Maintenance: Pap:  11/14/2013 WNL NEG HPV History of abnormal Pap:  no MMG:  done on Monday. It was normal will request report BMD:   never  Colonoscopy: 2013 normal, 10 year f/u per patient. Primary is scheduling  TDaP:  12/2021 Gardasil: n/a   reports that she has never smoked. She has never used smokeless tobacco. She reports current alcohol use of about 1.0 standard drink of alcohol per week. She reports that she does not use drugs. She is from [place], has family there.  2 grown sons. Oldest son is in [place], got married. He is getting his PhD in Consulting civil engineer. Other son is local, married. Has a 75.67 year old little boy. 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  Past Medical History:  Diagnosis Date   Anemia    Anxiety    Asymptomatic bacteriuria    Blood transfusion    32 yrs ago   Elevated cholesterol    History of kidney stones    Hypertension    Renal disease    UTI (lower urinary tract infection)    recurrent    Past Surgical History:  Procedure Laterality Date   APPENDECTOMY  1972   ENDOMETRIAL BIOPSY  08/22/2001   ROBOTIC ASSISTED LAPAROSCOPIC VAGINAL HYSTERECTOMY WITH FIBROID REMOVAL  12/19/10   Ovaries remain   URETER SURGERY  1980    Current Outpatient Medications  Medication Sig Dispense Refill   amLODipine (NORVASC) 10 MG tablet Take 10 mg by mouth daily.     Ascorbic Acid (VITAMIN C PO) Take by mouth.     Coenzyme Q10 (CO Q 10 PO) Take 300 mg by mouth daily.     ELDERBERRY PO Take by mouth.     MAGNESIUM PO  Take by mouth.     Multiple Vitamins-Minerals (MULTIVITAMIN WITH MINERALS) tablet Take 1 tablet by mouth daily.       pravastatin (PRAVACHOL) 40 MG tablet Take 40 mg by mouth daily.     valsartan (DIOVAN) 320 MG tablet Take 320 mg by mouth daily.     VITAMIN D PO Take by mouth.     No current facility-administered medications for this visit.    Family History  Problem Relation Age of Onset   Diabetes Father    Hypertension Father    Hyperlipidemia Sister    Hypertension Sister    Hyperlipidemia Sister    Hypertension Sister    Hyperlipidemia Sister    Hypertension Sister    Hyperlipidemia Sister    Hypertension Sister     Review of Systems  Exam:   LMP 11/28/2010 (Exact Date)   Weight change: '@WEIGHTCHANGE'$ @ Height:      Ht Readings from Last 3 Encounters:  01/27/21 '5\' 4"'$  (1.626 m)  01/21/20 '5\' 4"'$  (1.626 m)  01/16/19 '5\' 4"'$  (1.626 m)    General appearance: alert, cooperative and appears stated age Head: Normocephalic, without obvious abnormality, atraumatic Neck: no adenopathy, supple, symmetrical, trachea midline  and thyroid normal to inspection and palpation Lungs: clear to auscultation bilaterally Cardiovascular: regular rate and rhythm Breasts: normal appearance, no masses or tenderness Abdomen: soft, non-tender; non distended,  no masses,  no organomegaly Extremities: extremities normal, atraumatic, no cyanosis or edema Skin: Skin color, texture, turgor normal. No rashes or lesions Lymph nodes: Cervical, supraclavicular, and axillary nodes normal. No abnormal inguinal nodes palpated Neurologic: Grossly normal   Pelvic: External genitalia:  no lesions              Urethra:  normal appearing urethra with no masses, tenderness or lesions              Bartholins and Skenes: normal                 Vagina: normal appearing vagina with normal color and discharge, no lesions              Cervix: absent               Bimanual Exam:  Uterus:  uterus absent               Adnexa: no mass, fullness, tenderness               Rectovaginal: Confirms               Anus:  normal sphincter tone, no lesions  Gae Dry, CMA chaperoned for the exam.  1. Well woman exam Discussed breast self exam Discussed calcium and vit D intake Mammogram just done Colonoscopy being scheduled Labs with primary

## 2022-02-01 ENCOUNTER — Ambulatory Visit (INDEPENDENT_AMBULATORY_CARE_PROVIDER_SITE_OTHER): Payer: BC Managed Care – PPO | Admitting: Obstetrics and Gynecology

## 2022-02-01 ENCOUNTER — Encounter: Payer: Self-pay | Admitting: Obstetrics and Gynecology

## 2022-02-01 VITALS — BP 110/70 | HR 66 | Ht 64.0 in | Wt 187.0 lb

## 2022-02-01 DIAGNOSIS — Z01419 Encounter for gynecological examination (general) (routine) without abnormal findings: Secondary | ICD-10-CM | POA: Diagnosis not present

## 2022-02-01 NOTE — Patient Instructions (Signed)

## 2023-02-07 ENCOUNTER — Encounter: Payer: Self-pay | Admitting: Radiology

## 2023-02-07 ENCOUNTER — Ambulatory Visit: Payer: BC Managed Care – PPO | Admitting: Obstetrics and Gynecology

## 2023-02-07 ENCOUNTER — Ambulatory Visit (INDEPENDENT_AMBULATORY_CARE_PROVIDER_SITE_OTHER): Payer: BC Managed Care – PPO | Admitting: Radiology

## 2023-02-07 VITALS — BP 136/88 | Ht 64.0 in | Wt 189.0 lb

## 2023-02-07 DIAGNOSIS — Z01419 Encounter for gynecological examination (general) (routine) without abnormal findings: Secondary | ICD-10-CM | POA: Diagnosis not present

## 2023-02-07 DIAGNOSIS — Z13820 Encounter for screening for osteoporosis: Secondary | ICD-10-CM

## 2023-02-07 NOTE — Progress Notes (Signed)
Regis Capurro 1960/08/07 [id number]   History:  62 y.o. G2P2 presents for annual exam s/p hyst. No gyn concerns.   Gynecologic History Hysterectomy  Sexually active: yes, no dyspareunia  Health Maintenance Last Pap: 2010. Results were: normal Last mammogram: 02/05/23. Results pending Last colonoscopy: 05/2022   Past medical history, past surgical history, family history and social history were all reviewed and documented in the EPIC chart.  ROS:  A ROS was performed and pertinent positives and negatives are included.  Exam:  Vitals:   02/07/23 1007  BP: 136/88  Weight: 189 lb (85.7 kg)  Height: 5\' 4"  (1.626 m)   Body mass index is 32.44 kg/m.  General appearance:  Normal Thyroid:  Symmetrical, normal in size, without palpable masses or nodularity. Respiratory  Auscultation:  Clear without wheezing or rhonchi Cardiovascular  Auscultation:  Regular rate, without rubs, murmurs or gallops  Edema/varicosities:  Not grossly evident Abdominal  Soft,nontender, without masses, guarding or rebound.  Liver/spleen:  No organomegaly noted  Hernia:  None appreciated  Skin  Inspection:  Grossly normal Breasts: Examined lying and sitting.   Right: Without masses, retractions, nipple discharge or axillary adenopathy.   Left: Without masses, retractions, nipple discharge or axillary adenopathy. Genitourinary   Inguinal/mons:  Normal without inguinal adenopathy  External genitalia:  Normal appearing vulva with no masses, tenderness, or lesions  BUS/Urethra/Skene's glands:  Normal  Vagina:  Normal appearing with normal color and discharge, no lesions. Atrophy mild  Cervix:  absent  Uterus:  absent  Adnexa/parametria:     Rt: Normal in size, without masses or tenderness.   Lt: Normal in size, without masses or tenderness.  Anus and perineum: Normal   Raynelle Fanning, CMA present for exam  Assessment/Plan:   1. Well woman exam with routine gynecological exam Up to date on  screenings  2. Screening for osteoporosis - DG Bone Density; Future    Discussed SBE, colonoscopy and DEXA screening as appropriate. Encouraged 186mins/week of cardiovascular and weight bearing exercise minimum. Recommend the use of seatbelts and sunscreen consistently.   Return in 1 year for annual or sooner prn.  Arlie Solomons B WHNP-BC 10:54 AM 02/07/2023

## 2023-03-20 ENCOUNTER — Ambulatory Visit (HOSPITAL_BASED_OUTPATIENT_CLINIC_OR_DEPARTMENT_OTHER)
Admission: RE | Admit: 2023-03-20 | Discharge: 2023-03-20 | Disposition: A | Discharge status: Home / Self Care | Payer: BC Managed Care – PPO | Source: Ambulatory Visit | Attending: Radiology | Admitting: Radiology

## 2023-03-20 DIAGNOSIS — Z13820 Encounter for screening for osteoporosis: Secondary | ICD-10-CM | POA: Diagnosis present

## 2024-02-08 ENCOUNTER — Encounter: Payer: Self-pay | Admitting: Radiology

## 2024-02-08 ENCOUNTER — Ambulatory Visit (INDEPENDENT_AMBULATORY_CARE_PROVIDER_SITE_OTHER): Payer: BC Managed Care – PPO | Admitting: Radiology

## 2024-02-08 VITALS — BP 136/84 | HR 70 | Ht 63.75 in | Wt 185.0 lb

## 2024-02-08 DIAGNOSIS — Z1331 Encounter for screening for depression: Secondary | ICD-10-CM

## 2024-02-08 DIAGNOSIS — Z01419 Encounter for gynecological examination (general) (routine) without abnormal findings: Secondary | ICD-10-CM | POA: Diagnosis not present

## 2024-02-08 NOTE — Progress Notes (Signed)
   Jaime Ingram 10-06-1960 [id number]   History:  63 y.o. G2P2 presents for annual exam. No  gyn concerns. S/p hyst.   Gynecologic History Hysterectomy    Health Maintenance Last mammogram: 01/2023. Results were: normal Last colonoscopy: 05/26/22. Results were:       02/08/2024   10:02 AM  Depression screen PHQ 2/9  Decreased Interest 0  Down, Depressed, Hopeless 0  PHQ - 2 Score 0     Past medical history, past surgical history, family history and social history were all reviewed and documented in the EPIC chart.  ROS:  A ROS was performed and pertinent positives and negatives are included.  Exam:  Vitals:   02/08/24 1001  BP: 136/84  Pulse: 70  SpO2: 99%  Weight: 185 lb (83.9 kg)  Height: 5' 3.75 (1.619 m)   Body mass index is 32 kg/m.  General appearance:  Normal Thyroid:  Symmetrical, normal in size, without palpable masses or nodularity. Respiratory  Auscultation:  Clear without wheezing or rhonchi Cardiovascular  Auscultation:  Regular rate, without rubs, murmurs or gallops  Edema/varicosities:  Not grossly evident Abdominal  Soft,nontender, without masses, guarding or rebound.  Liver/spleen:  No organomegaly noted  Hernia:  None appreciated  Skin  Inspection:  Grossly normal Breasts: Examined lying and sitting.   Right: Without masses, retractions, nipple discharge or axillary adenopathy.   Left: Without masses, retractions, nipple discharge or axillary adenopathy. Genitourinary   Inguinal/mons:  Normal without inguinal adenopathy  External genitalia:  Normal appearing vulva with no masses, tenderness, or lesions  BUS/Urethra/Skene's glands:  Normal  Vagina:  Normal appearing with normal color and discharge, no lesions. Atrophy mild  Cervix:  absent  Uterus:  absent  Adnexa/parametria:     Rt: Normal in size, without masses or tenderness.   Lt: Normal in size, without masses or tenderness.  Anus and perineum: Normal   Jaime Ingram, CMA present  for exam  Assessment/Plan:   1. Well woman exam with routine gynecological exam (Primary) Pap 2026 Mammo next week Labs with PCP  2. Depression screening    Return in 1 year for annual or sooner prn.  Jaime Ingram B WHNP-BC 10:35 AM 02/08/2024

## 2024-02-08 NOTE — Patient Instructions (Signed)
 Preventive Care 58-63 Years Old, Female  Preventive care refers to lifestyle choices and visits with your health care provider that can promote health and wellness. Preventive care visits are also called wellness exams.  What can I expect for my preventive care visit?  Counseling  Your health care provider may ask you questions about your:  Medical history, including:  Past medical problems.  Family medical history.  Pregnancy history.  Current health, including:  Menstrual cycle.  Method of birth control.  Emotional well-being.  Home life and relationship well-being.  Sexual activity and sexual health.  Lifestyle, including:  Alcohol, nicotine or tobacco, and drug use.  Access to firearms.  Diet, exercise, and sleep habits.  Work and work Astronomer.  Sunscreen use.  Safety issues such as seatbelt and bike helmet use.  Physical exam  Your health care provider will check your:  Height and weight. These may be used to calculate your BMI (body mass index). BMI is a measurement that tells if you are at a healthy weight.  Waist circumference. This measures the distance around your waistline. This measurement also tells if you are at a healthy weight and may help predict your risk of certain diseases, such as type 2 diabetes and high blood pressure.  Heart rate and blood pressure.  Body temperature.  Skin for abnormal spots.  What immunizations do I need?    Vaccines are usually given at various ages, according to a schedule. Your health care provider will recommend vaccines for you based on your age, medical history, and lifestyle or other factors, such as travel or where you work.  What tests do I need?  Screening  Your health care provider may recommend screening tests for certain conditions. This may include:  Lipid and cholesterol levels.  Diabetes screening. This is done by checking your blood sugar (glucose) after you have not eaten for a while (fasting).  Pelvic exam and Pap test.  Hepatitis B test.  Hepatitis C  test.  HIV (human immunodeficiency virus) test.  STI (sexually transmitted infection) testing, if you are at risk.  Lung cancer screening.  Colorectal cancer screening.  Mammogram. Talk with your health care provider about when you should start having regular mammograms. This may depend on whether you have a family history of breast cancer.  BRCA-related cancer screening. This may be done if you have a family history of breast, ovarian, tubal, or peritoneal cancers.  Bone density scan. This is done to screen for osteoporosis.  Talk with your health care provider about your test results, treatment options, and if necessary, the need for more tests.  Follow these instructions at home:  Eating and drinking    Eat a diet that includes fresh fruits and vegetables, whole grains, lean protein, and low-fat dairy products.  Take vitamin and mineral supplements as recommended by your health care provider.  Do not drink alcohol if:  Your health care provider tells you not to drink.  You are pregnant, may be pregnant, or are planning to become pregnant.  If you drink alcohol:  Limit how much you have to 0-1 drink a day.  Know how much alcohol is in your drink. In the [place] one drink equals one 12 oz bottle of beer (355 mL), one 5 oz glass of wine (148 mL), or one 1 oz glass of hard liquor (44 mL).  Lifestyle  Brush your teeth every morning and night with fluoride toothpaste. Floss one time each day.  Exercise for at least  30 minutes 5 or more days each week.  Do not use any products that contain nicotine or tobacco. These products include cigarettes, chewing tobacco, and vaping devices, such as e-cigarettes. If you need help quitting, ask your health care provider.  Do not use drugs.  If you are sexually active, practice safe sex. Use a condom or other form of protection to prevent STIs.  If you do not wish to become pregnant, use a form of birth control. If you plan to become pregnant, see your health care provider for a  prepregnancy visit.  Take aspirin only as told by your health care provider. Make sure that you understand how much to take and what form to take. Work with your health care provider to find out whether it is safe and beneficial for you to take aspirin daily.  Find healthy ways to manage stress, such as:  Meditation, yoga, or listening to music.  Journaling.  Talking to a trusted person.  Spending time with friends and family.  Minimize exposure to UV radiation to reduce your risk of skin cancer.  Safety  Always wear your seat belt while driving or riding in a vehicle.  Do not drive:  If you have been drinking alcohol. Do not ride with someone who has been drinking.  When you are tired or distracted.  While texting.  If you have been using any mind-altering substances or drugs.  Wear a helmet and other protective equipment during sports activities.  If you have firearms in your house, make sure you follow all gun safety procedures.  Seek help if you have been physically or sexually abused.  What's next?  Visit your health care provider once a year for an annual wellness visit.  Ask your health care provider how often you should have your eyes and teeth checked.  Stay up to date on all vaccines.  This information is not intended to replace advice given to you by your health care provider. Make sure you discuss any questions you have with your health care provider.  Document Revised: 10/20/2020 Document Reviewed: 10/20/2020  Elsevier Patient Education  2024 [employer].

## 2024-02-11 LAB — HM MAMMOGRAPHY

## 2025-02-13 ENCOUNTER — Ambulatory Visit: Admitting: Radiology
